# Patient Record
Sex: Female | Born: 1980 | Race: Black or African American | Hispanic: No | Smoking: Never smoker
Health system: Southern US, Community
[De-identification: ages and names within clinical notes are randomized; demographics above are authoritative.]

## PROBLEM LIST (undated history)

## (undated) DIAGNOSIS — N390 Urinary tract infection, site not specified: Secondary | ICD-10-CM

## (undated) DIAGNOSIS — F319 Bipolar disorder, unspecified: Secondary | ICD-10-CM

## (undated) DIAGNOSIS — F329 Major depressive disorder, single episode, unspecified: Secondary | ICD-10-CM

## (undated) DIAGNOSIS — F909 Attention-deficit hyperactivity disorder, unspecified type: Secondary | ICD-10-CM

## (undated) DIAGNOSIS — F419 Anxiety disorder, unspecified: Secondary | ICD-10-CM

## (undated) DIAGNOSIS — F32A Depression, unspecified: Secondary | ICD-10-CM

## (undated) DIAGNOSIS — F4310 Post-traumatic stress disorder, unspecified: Secondary | ICD-10-CM

## (undated) DIAGNOSIS — R51 Headache: Secondary | ICD-10-CM

## (undated) DIAGNOSIS — R519 Headache, unspecified: Secondary | ICD-10-CM

## (undated) HISTORY — PX: ECTOPIC PREGNANCY SURGERY: SHX613

## (undated) HISTORY — PX: LAPAROSCOPY: SHX197

## (undated) HISTORY — PX: WISDOM TOOTH EXTRACTION: SHX21

## (undated) HISTORY — PX: TUBAL LIGATION: SHX77

---
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
## 2000-02-22 ENCOUNTER — Inpatient Hospital Stay (HOSPITAL_COMMUNITY): Admission: AD | Admit: 2000-02-22 | Discharge: 2000-02-22 | Payer: Self-pay | Admitting: *Deleted

## 2000-07-29 ENCOUNTER — Inpatient Hospital Stay (HOSPITAL_COMMUNITY): Admission: AD | Admit: 2000-07-29 | Discharge: 2000-07-31 | Payer: Self-pay | Admitting: Obstetrics and Gynecology

## 2000-09-11 ENCOUNTER — Other Ambulatory Visit: Admission: RE | Admit: 2000-09-11 | Discharge: 2000-09-11 | Payer: Self-pay | Admitting: Obstetrics and Gynecology

## 2000-10-23 ENCOUNTER — Inpatient Hospital Stay (HOSPITAL_COMMUNITY): Admission: AD | Admit: 2000-10-23 | Discharge: 2000-10-23 | Payer: Self-pay | Admitting: Obstetrics and Gynecology

## 2001-05-15 ENCOUNTER — Inpatient Hospital Stay (HOSPITAL_COMMUNITY): Admission: AD | Admit: 2001-05-15 | Discharge: 2001-05-15 | Payer: Self-pay | Admitting: Obstetrics and Gynecology

## 2002-01-16 ENCOUNTER — Other Ambulatory Visit: Admission: RE | Admit: 2002-01-16 | Discharge: 2002-01-16 | Payer: Self-pay | Admitting: Obstetrics and Gynecology

## 2002-03-01 ENCOUNTER — Ambulatory Visit (HOSPITAL_COMMUNITY): Admission: RE | Admit: 2002-03-01 | Discharge: 2002-03-01 | Payer: Self-pay | Admitting: Obstetrics and Gynecology

## 2002-04-01 ENCOUNTER — Inpatient Hospital Stay (HOSPITAL_COMMUNITY): Admission: AD | Admit: 2002-04-01 | Discharge: 2002-04-01 | Payer: Self-pay | Admitting: Obstetrics and Gynecology

## 2002-10-21 ENCOUNTER — Other Ambulatory Visit: Admission: RE | Admit: 2002-10-21 | Discharge: 2002-10-21 | Payer: Self-pay | Admitting: Obstetrics and Gynecology

## 2002-11-08 ENCOUNTER — Emergency Department (HOSPITAL_COMMUNITY): Admission: EM | Admit: 2002-11-08 | Discharge: 2002-11-08 | Payer: Self-pay | Admitting: Emergency Medicine

## 2002-12-28 ENCOUNTER — Emergency Department (HOSPITAL_COMMUNITY): Admission: EM | Admit: 2002-12-28 | Discharge: 2002-12-28 | Payer: Self-pay | Admitting: Emergency Medicine

## 2003-10-23 ENCOUNTER — Emergency Department (HOSPITAL_COMMUNITY): Admission: EM | Admit: 2003-10-23 | Discharge: 2003-10-23 | Payer: Self-pay | Admitting: Emergency Medicine

## 2004-06-08 ENCOUNTER — Emergency Department (HOSPITAL_COMMUNITY): Admission: EM | Admit: 2004-06-08 | Discharge: 2004-06-08 | Payer: Self-pay | Admitting: *Deleted

## 2004-09-04 ENCOUNTER — Emergency Department (HOSPITAL_COMMUNITY): Admission: EM | Admit: 2004-09-04 | Discharge: 2004-09-04 | Payer: Self-pay | Admitting: *Deleted

## 2004-10-07 ENCOUNTER — Emergency Department (HOSPITAL_COMMUNITY): Admission: EM | Admit: 2004-10-07 | Discharge: 2004-10-07 | Payer: Self-pay | Admitting: Emergency Medicine

## 2005-04-12 ENCOUNTER — Emergency Department (HOSPITAL_COMMUNITY): Admission: EM | Admit: 2005-04-12 | Discharge: 2005-04-12 | Payer: Self-pay | Admitting: Emergency Medicine

## 2005-06-18 ENCOUNTER — Emergency Department (HOSPITAL_COMMUNITY): Admission: EM | Admit: 2005-06-18 | Discharge: 2005-06-18 | Payer: Self-pay | Admitting: Emergency Medicine

## 2005-06-30 ENCOUNTER — Emergency Department (HOSPITAL_COMMUNITY): Admission: EM | Admit: 2005-06-30 | Discharge: 2005-06-30 | Payer: Self-pay | Admitting: Emergency Medicine

## 2005-07-12 ENCOUNTER — Ambulatory Visit (HOSPITAL_COMMUNITY): Admission: RE | Admit: 2005-07-12 | Discharge: 2005-07-12 | Payer: Self-pay | Admitting: Obstetrics & Gynecology

## 2005-12-01 ENCOUNTER — Inpatient Hospital Stay (HOSPITAL_COMMUNITY): Admission: AD | Admit: 2005-12-01 | Discharge: 2005-12-01 | Payer: Self-pay | Admitting: Obstetrics

## 2007-12-24 ENCOUNTER — Emergency Department (HOSPITAL_COMMUNITY): Admission: EM | Admit: 2007-12-24 | Discharge: 2007-12-24 | Payer: Self-pay | Admitting: Emergency Medicine

## 2008-05-21 ENCOUNTER — Emergency Department (HOSPITAL_COMMUNITY): Admission: EM | Admit: 2008-05-21 | Discharge: 2008-05-21 | Payer: Self-pay | Admitting: Emergency Medicine

## 2008-10-06 ENCOUNTER — Emergency Department (HOSPITAL_COMMUNITY): Admission: EM | Admit: 2008-10-06 | Discharge: 2008-10-07 | Payer: Self-pay | Admitting: Emergency Medicine

## 2008-11-17 ENCOUNTER — Inpatient Hospital Stay (HOSPITAL_COMMUNITY): Admission: AD | Admit: 2008-11-17 | Discharge: 2008-11-17 | Payer: Self-pay | Admitting: Obstetrics and Gynecology

## 2008-12-10 ENCOUNTER — Ambulatory Visit: Payer: Self-pay | Admitting: Obstetrics and Gynecology

## 2009-01-19 ENCOUNTER — Ambulatory Visit: Payer: Self-pay | Admitting: Obstetrics & Gynecology

## 2009-01-19 ENCOUNTER — Ambulatory Visit (HOSPITAL_COMMUNITY): Admission: RE | Admit: 2009-01-19 | Discharge: 2009-01-19 | Payer: Self-pay | Admitting: Obstetrics & Gynecology

## 2009-02-04 ENCOUNTER — Ambulatory Visit: Payer: Self-pay | Admitting: Obstetrics and Gynecology

## 2009-02-19 ENCOUNTER — Ambulatory Visit: Payer: Self-pay | Admitting: Obstetrics & Gynecology

## 2009-02-26 ENCOUNTER — Inpatient Hospital Stay (HOSPITAL_COMMUNITY): Admission: AD | Admit: 2009-02-26 | Discharge: 2009-02-26 | Payer: Self-pay | Admitting: Obstetrics & Gynecology

## 2009-03-19 ENCOUNTER — Ambulatory Visit: Payer: Self-pay | Admitting: Obstetrics and Gynecology

## 2009-08-19 ENCOUNTER — Emergency Department (HOSPITAL_COMMUNITY): Admission: EM | Admit: 2009-08-19 | Discharge: 2009-08-19 | Payer: Self-pay | Admitting: Family Medicine

## 2009-10-14 ENCOUNTER — Ambulatory Visit: Payer: Self-pay | Admitting: Obstetrics and Gynecology

## 2009-10-16 ENCOUNTER — Ambulatory Visit (HOSPITAL_COMMUNITY): Admission: RE | Admit: 2009-10-16 | Discharge: 2009-10-16 | Payer: Self-pay | Admitting: Obstetrics and Gynecology

## 2009-11-24 ENCOUNTER — Emergency Department (HOSPITAL_COMMUNITY)
Admission: EM | Admit: 2009-11-24 | Discharge: 2009-11-25 | Payer: Self-pay | Source: Home / Self Care | Admitting: Emergency Medicine

## 2010-01-08 ENCOUNTER — Emergency Department (HOSPITAL_COMMUNITY)
Admission: EM | Admit: 2010-01-08 | Discharge: 2010-01-08 | Payer: Self-pay | Source: Home / Self Care | Admitting: Family Medicine

## 2010-01-13 ENCOUNTER — Ambulatory Visit: Payer: Self-pay | Admitting: Obstetrics & Gynecology

## 2010-01-27 ENCOUNTER — Ambulatory Visit
Admission: RE | Admit: 2010-01-27 | Discharge: 2010-01-27 | Payer: Self-pay | Source: Home / Self Care | Attending: Obstetrics and Gynecology | Admitting: Obstetrics and Gynecology

## 2010-03-30 LAB — WET PREP, GENITAL: Yeast Wet Prep HPF POC: NONE SEEN

## 2010-03-30 LAB — URINE MICROSCOPIC-ADD ON

## 2010-03-30 LAB — URINALYSIS, ROUTINE W REFLEX MICROSCOPIC
Glucose, UA: NEGATIVE mg/dL
Ketones, ur: NEGATIVE mg/dL
Leukocytes, UA: NEGATIVE
pH: 7.5 (ref 5.0–8.0)

## 2010-03-30 LAB — HEPATIC FUNCTION PANEL
AST: 18 U/L (ref 0–37)
Alkaline Phosphatase: 54 U/L (ref 39–117)
Bilirubin, Direct: 0.1 mg/dL (ref 0.0–0.3)
Total Bilirubin: 0.6 mg/dL (ref 0.3–1.2)

## 2010-03-30 LAB — BASIC METABOLIC PANEL
CO2: 22 mEq/L (ref 19–32)
Chloride: 107 mEq/L (ref 96–112)
Glucose, Bld: 106 mg/dL — ABNORMAL HIGH (ref 70–99)
Potassium: 4 mEq/L (ref 3.5–5.1)
Sodium: 139 mEq/L (ref 135–145)

## 2010-03-30 LAB — DIFFERENTIAL
Basophils Relative: 0 % (ref 0–1)
Eosinophils Absolute: 0 10*3/uL (ref 0.0–0.7)
Lymphs Abs: 1.2 10*3/uL (ref 0.7–4.0)
Monocytes Absolute: 0.6 10*3/uL (ref 0.1–1.0)
Monocytes Relative: 7 % (ref 3–12)

## 2010-03-30 LAB — CBC
HCT: 35.8 % — ABNORMAL LOW (ref 36.0–46.0)
Hemoglobin: 12.1 g/dL (ref 12.0–15.0)
MCH: 28.1 pg (ref 26.0–34.0)
MCHC: 33.8 g/dL (ref 30.0–36.0)

## 2010-03-30 LAB — LIPASE, BLOOD: Lipase: 25 U/L (ref 11–59)

## 2010-04-02 LAB — CBC
HCT: 39.1 % (ref 36.0–46.0)
MCH: 27.4 pg (ref 26.0–34.0)
MCHC: 32.5 g/dL (ref 30.0–36.0)
MCV: 84.3 fL (ref 78.0–100.0)
Platelets: 242 10*3/uL (ref 150–400)
RDW: 14.4 % (ref 11.5–15.5)
WBC: 5.1 10*3/uL (ref 4.0–10.5)

## 2010-04-02 LAB — POCT I-STAT, CHEM 8
Calcium, Ion: 1.14 mmol/L (ref 1.12–1.32)
Chloride: 107 mEq/L (ref 96–112)
Glucose, Bld: 78 mg/dL (ref 70–99)
HCT: 43 % (ref 36.0–46.0)
TCO2: 25 mmol/L (ref 0–100)

## 2010-04-02 LAB — DIFFERENTIAL
Basophils Absolute: 0 10*3/uL (ref 0.0–0.1)
Basophils Relative: 0 % (ref 0–1)
Eosinophils Absolute: 0.1 10*3/uL (ref 0.0–0.7)
Eosinophils Relative: 2 % (ref 0–5)
Lymphocytes Relative: 34 % (ref 12–46)
Monocytes Absolute: 0.6 10*3/uL (ref 0.1–1.0)

## 2010-04-02 LAB — POCT URINALYSIS DIPSTICK
Glucose, UA: NEGATIVE mg/dL
Nitrite: NEGATIVE
Protein, ur: NEGATIVE mg/dL
Urobilinogen, UA: 0.2 mg/dL (ref 0.0–1.0)

## 2010-04-03 LAB — PREGNANCY, URINE: Preg Test, Ur: NEGATIVE

## 2010-04-03 LAB — CBC
HCT: 37.2 % (ref 36.0–46.0)
Platelets: 227 10*3/uL (ref 150–400)
RDW: 14.2 % (ref 11.5–15.5)
WBC: 4.9 10*3/uL (ref 4.0–10.5)

## 2010-04-21 LAB — URINALYSIS, ROUTINE W REFLEX MICROSCOPIC
Ketones, ur: NEGATIVE mg/dL
Leukocytes, UA: NEGATIVE
Nitrite: NEGATIVE
Protein, ur: NEGATIVE mg/dL
Urobilinogen, UA: 0.2 mg/dL (ref 0.0–1.0)

## 2010-04-21 LAB — WET PREP, GENITAL

## 2010-04-21 LAB — CBC
HCT: 35 % — ABNORMAL LOW (ref 36.0–46.0)
Hemoglobin: 11.6 g/dL — ABNORMAL LOW (ref 12.0–15.0)
MCHC: 33 g/dL (ref 30.0–36.0)
RBC: 4.08 MIL/uL (ref 3.87–5.11)

## 2010-04-21 LAB — COMPREHENSIVE METABOLIC PANEL
ALT: 10 U/L (ref 0–35)
Alkaline Phosphatase: 47 U/L (ref 39–117)
BUN: 8 mg/dL (ref 6–23)
CO2: 26 mEq/L (ref 19–32)
GFR calc non Af Amer: >60 mL/min (ref >60)
Glucose, Bld: 89 mg/dL (ref 70–99)
Potassium: 3.7 mEq/L (ref 3.5–5.1)
Sodium: 137 mEq/L (ref 135–145)

## 2010-04-21 LAB — URINE MICROSCOPIC-ADD ON

## 2010-04-21 LAB — POCT PREGNANCY, URINE: Preg Test, Ur: NEGATIVE

## 2010-04-21 LAB — GC/CHLAMYDIA PROBE AMP, GENITAL: Chlamydia, DNA Probe: NEGATIVE

## 2010-04-23 LAB — URINE MICROSCOPIC-ADD ON

## 2010-04-23 LAB — COMPREHENSIVE METABOLIC PANEL
Albumin: 4 g/dL (ref 3.5–5.2)
Alkaline Phosphatase: 53 U/L (ref 39–117)
BUN: 7 mg/dL (ref 6–23)
Creatinine, Ser: 0.67 mg/dL (ref 0.4–1.2)
Potassium: 4 mEq/L (ref 3.5–5.1)
Total Protein: 7.3 g/dL (ref 6.0–8.3)

## 2010-04-23 LAB — DIFFERENTIAL
Lymphocytes Relative: 28 % (ref 12–46)
Monocytes Absolute: 0.8 10*3/uL (ref 0.1–1.0)
Monocytes Relative: 10 % (ref 3–12)
Neutro Abs: 4.4 10*3/uL (ref 1.7–7.7)

## 2010-04-23 LAB — URINALYSIS, ROUTINE W REFLEX MICROSCOPIC
Glucose, UA: NEGATIVE mg/dL
Specific Gravity, Urine: 1.026 (ref 1.005–1.030)
Urobilinogen, UA: 1 mg/dL (ref 0.0–1.0)
pH: 6.5 (ref 5.0–8.0)

## 2010-04-23 LAB — CBC
HCT: 39.2 % (ref 36.0–46.0)
Platelets: 215 10*3/uL (ref 150–400)
RDW: 14.9 % (ref 11.5–15.5)

## 2010-04-27 LAB — POCT CARDIAC MARKERS
CKMB, poc: <1 ng/mL — ABNORMAL LOW (ref 1.0–8.0)
Myoglobin, poc: 33 ng/mL (ref 12–200)
Troponin i, poc: <0.05 ng/mL (ref 0.00–0.09)

## 2010-06-04 NOTE — H&P (Signed)
   NAME:  Joy Pittman, Joy Pittman                         ACCOUNT NO.:  [id number]   MEDICAL RECORD NO.:  [id number]                   PATIENT TYPE:  AMB   LOCATION:  SDC                                  FACILITY:  [facility]   PHYSICIAN:  James A. Ashley Royalty, M.D.             DATE OF BIRTH:  07/18/1980   DATE OF ADMISSION:  03/01/2002  DATE OF DISCHARGE:                                HISTORY & PHYSICAL   BRIEF HISTORY:  The patient is a 30 year old gravida 4, para 3, AB 1, states  a desire for attempt at permanent surgical sterilization.   MEDICATIONS:  None.   PAST MEDICAL HISTORY:  Negative.   PAST SURGICAL HISTORY:  Ectopic pregnancy May 2001.   ALLERGIES:  None.   FAMILY HISTORY:  Noncontributory.   SOCIAL HISTORY:  The patient denies use of tobacco or significant alcohol.   REVIEW OF SYSTEMS:  Noncontributory.   PHYSICAL EXAMINATION:  GENERAL:  Well-developed, well-nourished pleasant  black female in no acute distress.  VITAL SIGNS:  Afebrile; vital signs stable.  SKIN:  Warm and dry without lesions.  LYMPHATICS:  There is no supraclavicular, cervical, or inguinal adenopathy.  HEENT:  Normocephalic.  NECK:  Supple without thyromegaly.  CHEST:  Lungs clear.  CARDIAC:  Regular rate and rhythm without murmurs, gallops, or rubs.  BACK:  Exam deferred.  ABDOMEN:  Soft and nontender without masses or organomegaly.  Bowel sounds  are active.  MUSCULOSKELETAL:  Full range of motion without edema, cyanosis, or CVA  tenderness.  PELVIC:  Examination is deferred until examination under anesthesia.   IMPRESSION:  Desire for attempt at permanent surgical sterilization.    PLAN:  Laparoscopic bilateral tubal sterilization procedure.  The risks,  benefits, complications, and alternatives fully discussed with the patient.  Permanency and failure rates of various techniques including but not limited  to Falope-Ring, bipolar cautery, and mini laparotomy with partial  salpingectomy discussed and  accepted.  Questions invited and answered.                                               James A. Ashley Royalty, M.D.    JAM/MEDQ  D:  03/01/2002  T:  03/01/2002  Job:  [id number]

## 2010-06-04 NOTE — Op Note (Signed)
[facility]  Patient:    Joy Pittman                      MRN: [id number] Proc. Date: 06/11/99 Adm. Date:  [id number] Disc. Date: [id number] Attending:  Deniece Ree CC:         Deniece Ree, M.D.                           Operative Report  PREOPERATIVE DIAGNOSIS:       Ruptured ectopic pregnancy.  POSTOPERATIVE DIAGNOSIS:      Ruptured ectopic pregnancy.  OPERATION:  SURGEON:                      Kathreen Cosier, M.D.  ASSISTANT:  ANESTHESIA:  ESTIMATED BLOOD LOSS:  DESCRIPTION OF PROCEDURE:     The patient was placed on the operating table in he supine position and general anesthesia administered by Dorinda Hill T. Pamalee Leyden, M.D. he abdomen was prepped and draped.  The bladder was emptied with a Foley catheter. A transverse suprapubic incision was made and carried down to the rectus fascia. The fascia cleaned and incised the length of the incision.  The rectus muscles were  retracted laterally and the peritoneum incised longitudinally.  500 cc of free blood was aspirated from the peritoneal cavity.  The uterus was normal size. The left tube and ovary were normal.  The right ovary was normal.  On the right side, she had a 6 x 6 cm ruptured ampullary ectopic pregnancy.  Kelly clamps were placed in the mesosalpinx below the tube in series and using Metzenbaum scissors, the distal portion of the tube was removed.  Hemostasis was achieved using interrupted sutures of 0 chromic.  The peritoneal cavity was irrigated and large amount of clots removed.  Abdomen closed in layers.  The peritoneum with continuous suture of 0 chromic, the fascia with continuous suture of 0 Dexon, and the skin closed with subcuticular stitch of 3-0 plain.  Estimated blood loss about 600 cc.  The patient tolerated the procedure well and was taken to the recovery room in good condition. DD:  06/11/99 TD:  06/15/99 Job: [id number]

## 2010-06-04 NOTE — Op Note (Signed)
NAME:  Joy Pittman, Joy Pittman                         ACCOUNT NO.:  [id number]   MEDICAL RECORD NO.:  [id number]                   PATIENT TYPE:  AMB   LOCATION:  SDC                                  FACILITY:  [facility]   PHYSICIAN:  James A. Ashley Royalty, M.D.             DATE OF BIRTH:  04-18-1980   DATE OF PROCEDURE:  03/01/2002  DATE OF DISCHARGE:                                 OPERATIVE REPORT   PREOPERATIVE DIAGNOSES:  1. Operative attempt at permanent surgical sterilization.  2. Previous ectopic pregnancy -- patient unsure as to which side and the     exact procedure performed.   POSTOPERATIVE DIAGNOSES:  1. Operative attempt at permanent surgical sterilization.  2. Status post apparent right ectopic with fimbriectomy.   PROCEDURE:  Laparoscopic bilateral tubal sterilization procedure (Falope  rings).   SURGEON:  Rudy Jew. Ashley Royalty, M.D.   ANESTHESIA:  General endotracheal.   ESTIMATED BLOOD LOSS:  Less than 10 cc.   COMPLICATIONS:  None.   PACKS AND DRAINS:  None.   DESCRIPTION OF PROCEDURE:  The patient was taken to the operating room and  placed in the dorsal supine position.  After adequate general endotracheal  anesthesia was administered, she was prepped and draped in the usual manner  for abdominal and vaginal surgery.  Posterior weighted retractor was placed  per vagina.  The anterior lip of the cervix was grasped with a single-  toothed tenaculum.  Gel-coated uterine manipulator was placed per cervix.  The bladder was drained with a red rubber catheter.   Next, 3 cc of 0.25% bupivacaine were instilled into the inferior aspect at  the umbilicus.  A 1.2 cm infraumbilical incision was made in the  longitudinal plane.  The size 10-11 disposable laparoscopic trocar was  placed into the abdominal cavity.  Its location was verified by placement of  the laparoscope.  There was no evidence of any trauma.  Pneumoperitoneum was  created with CO2 and maintained throughout the  procedure.   Next, the 7 mm low plane trocar was placed suprapubically in the midline,  through the patient's old laparotomy scar.  Direct visualization and  transillumination techniques were employed; 3 cc of bupivacaine preceded the  placement.  The pelvis was thoroughly surveyed.   The uterus was normal size, shape and contour.  The left fallopian tube was  normal size, shape and contour in length; with luxuriant fimbria.  The left  ovary was normal size, shape and contour without evidence of any cysts or  any endometriosis.  The right ovary was normal size, shape and contour  without evidence of any cysts or any endometriosis.  The right fallopian  tube was essentially normal size and shape, except distally where the  fimbria was noted to be absent.  It was surmised by the operator that the  patient apparently had an ectopic in the ampullary area, which was removed  by partial  salpingectomy and fimbriectomy.  Appropriate photos were  obtained.  The remainder of the peritoneal surfaces were smooth and  glistening.   At this point, the right fallopian tube was grasped and traced as far  distally as possible.  An avascular area in the ampullary portion was chosen  for ring placement.  A fallopian ring was applied.  At the initial placement  of the ring, the operator could not be 100% sure that an adequate purchase  was obtained.  Hence the fallopian was removed.  The operator removed  proximally and successfully placed fallopian ring in the distal isthmic  proximal ampullary portion.  An excellent knuckle of tube was noted to be  contained within the ring.  Excellent blanching of tissue was noted.  The  ring was clearly placed proximally, so that even if the fallopian would  recanalize distally the patient would not be at increased risk for pregnancy  (more than the accepted percentage chance from any typical fallopian tubal  sterilization procedure).   The left fallopian tube was  then grasped and traced to its fimbriated end.  An avascular area was chosen at distal isthmic to proximal ampullary portion  for ring placement.  A fallopian ring was applied without difficulty.  An  excellent knuckle of tube was noted to be contained within the ring.  Excellent blanching of tissue was noted.  Hemostasis was noted.   Appropriate photos were obtained.  At this point the patient was felt to  have benefited maximally from the surgical procedure.  The abdominal  instruments were removed and pneumoperitoneum evacuated.  The fascial  defects were closed with 0 Vicryl in an interrupted fashion.  The skin was  closed with 3-0 chromic in a subcuticular fashion.  The remainder of the 10  cc 0.25% bupivacaine were instilled into the incisions to aid in  postoperative analgesia.  Vaginal instruments were removed and hemostasis  noted.   The patient was then returned to the recovery room in excellent condition.                                                 James A. Ashley Royalty, M.D.    JAM/MEDQ  D:  03/01/2002  T:  03/01/2002  Job:  [id number]

## 2010-06-04 NOTE — Discharge Summary (Signed)
[facility]  Patient:    Joy Pittman                      MRN: [id number] Adm. Date:  [id number] Disc. Date: [id number] Attending:  Deniece Ree CC:         Deniece Ree, M.D.                           Discharge Summary  HOSPITAL COURSE:              The patient is a 30 year old gravida 3, para 2-0-0-2, whose last menstrual period was May 18, 1999.  She was admitted with right lower quadrant pain and generalized abdominal pain, hemoglobin 10.5, hCG 956.  Ultrasound revealed a large complex right adnexal mass suspicious for an ectopic.  There was a large amount of free fluid in the peritoneal cavity. Patient was taken to the operating room with a diagnosis of a ruptured right ectopic pregnancy and a 6 x 6-cm right ruptured ampullary ectopic was removed. She had a partial salpingectomy performed.  Both ovaries were normal and the left ______ was normal.  Her hemoglobin on admission was 10.5; postop 8.7. She was asymptomatic and was discharged home on the second postoperative day, ambulatory, on a regular diet, on Tylenol No. 3 one q.4h. p.r.n. for pain and ferrous sulfate 325 mg q.d., to see Dr. Deniece Ree in three weeks.  DISCHARGE DIAGNOSIS:          Status post partial salpingectomy for ruptured                               right ectopic pregnancy. DD:  06/13/99 TD:  06/14/99 Job: [id number]

## 2010-07-22 ENCOUNTER — Emergency Department (HOSPITAL_COMMUNITY): Payer: Medicaid Other

## 2010-07-22 ENCOUNTER — Inpatient Hospital Stay (INDEPENDENT_AMBULATORY_CARE_PROVIDER_SITE_OTHER)
Admission: RE | Admit: 2010-07-22 | Discharge: 2010-07-22 | Disposition: A | Discharge status: Home / Self Care | Payer: Medicaid Other | Source: Ambulatory Visit | Attending: Family Medicine | Admitting: Family Medicine

## 2010-07-22 ENCOUNTER — Emergency Department (HOSPITAL_COMMUNITY)
Admission: EM | Admit: 2010-07-22 | Discharge: 2010-07-22 | Disposition: A | Discharge status: Home / Self Care | Payer: Medicaid Other | Attending: Emergency Medicine | Admitting: Emergency Medicine

## 2010-07-22 DIAGNOSIS — R1013 Epigastric pain: Secondary | ICD-10-CM | POA: Insufficient documentation

## 2010-07-22 DIAGNOSIS — B9689 Other specified bacterial agents as the cause of diseases classified elsewhere: Secondary | ICD-10-CM | POA: Insufficient documentation

## 2010-07-22 DIAGNOSIS — R109 Unspecified abdominal pain: Secondary | ICD-10-CM

## 2010-07-22 DIAGNOSIS — N760 Acute vaginitis: Secondary | ICD-10-CM | POA: Insufficient documentation

## 2010-07-22 DIAGNOSIS — A499 Bacterial infection, unspecified: Secondary | ICD-10-CM | POA: Insufficient documentation

## 2010-07-22 DIAGNOSIS — N390 Urinary tract infection, site not specified: Secondary | ICD-10-CM | POA: Insufficient documentation

## 2010-07-22 LAB — COMPREHENSIVE METABOLIC PANEL
ALT: 9 U/L (ref 0–35)
AST: 23 U/L (ref 0–37)
Alkaline Phosphatase: 60 U/L (ref 39–117)
CO2: 26 mEq/L (ref 19–32)
Calcium: 9.4 mg/dL (ref 8.4–10.5)
Chloride: 102 mEq/L (ref 96–112)
GFR calc Af Amer: >60 mL/min (ref >60)
GFR calc non Af Amer: >60 mL/min (ref >60)
Glucose, Bld: 71 mg/dL (ref 70–99)
Potassium: 4.9 mEq/L (ref 3.5–5.1)
Sodium: 136 mEq/L (ref 135–145)
Total Bilirubin: 0.2 mg/dL — ABNORMAL LOW (ref 0.3–1.2)

## 2010-07-22 LAB — CBC
Hemoglobin: 12 g/dL (ref 12.0–15.0)
MCHC: 33 g/dL (ref 30.0–36.0)
Platelets: 243 10*3/uL (ref 150–400)
RBC: 4.45 MIL/uL (ref 3.87–5.11)

## 2010-07-22 LAB — POCT URINALYSIS DIP (DEVICE)
Bilirubin Urine: NEGATIVE
Glucose, UA: NEGATIVE mg/dL
Nitrite: NEGATIVE

## 2010-07-22 LAB — URINE MICROSCOPIC-ADD ON

## 2010-07-22 LAB — DIFFERENTIAL
Basophils Absolute: 0 10*3/uL (ref 0.0–0.1)
Basophils Relative: 1 % (ref 0–1)
Monocytes Absolute: 0.4 10*3/uL (ref 0.1–1.0)
Neutro Abs: 2.4 10*3/uL (ref 1.7–7.7)
Neutrophils Relative %: 50 % (ref 43–77)

## 2010-07-22 LAB — URINALYSIS, ROUTINE W REFLEX MICROSCOPIC
Glucose, UA: NEGATIVE mg/dL
Nitrite: POSITIVE — AB
Protein, ur: NEGATIVE mg/dL

## 2010-07-22 LAB — WET PREP, GENITAL: Trich, Wet Prep: NONE SEEN

## 2010-07-23 LAB — GC/CHLAMYDIA PROBE AMP, GENITAL: Chlamydia, DNA Probe: NEGATIVE

## 2010-07-24 LAB — URINE CULTURE: Culture  Setup Time: 201207051732

## 2010-08-04 ENCOUNTER — Emergency Department (HOSPITAL_COMMUNITY)
Admission: EM | Admit: 2010-08-04 | Discharge: 2010-08-04 | Discharge status: Against Medical Advice | Payer: Medicaid Other | Attending: Emergency Medicine | Admitting: Emergency Medicine

## 2010-08-04 DIAGNOSIS — R300 Dysuria: Secondary | ICD-10-CM | POA: Insufficient documentation

## 2010-10-22 LAB — URINALYSIS, ROUTINE W REFLEX MICROSCOPIC
Nitrite: NEGATIVE
Specific Gravity, Urine: 1.022 (ref 1.005–1.030)
Urobilinogen, UA: 1 mg/dL (ref 0.0–1.0)
pH: 6 (ref 5.0–8.0)

## 2010-10-22 LAB — URINE MICROSCOPIC-ADD ON

## 2010-10-22 LAB — GC/CHLAMYDIA PROBE AMP, GENITAL
Chlamydia, DNA Probe: NEGATIVE
GC Probe Amp, Genital: POSITIVE — AB

## 2010-10-22 LAB — WET PREP, GENITAL: Yeast Wet Prep HPF POC: NONE SEEN

## 2010-10-27 ENCOUNTER — Inpatient Hospital Stay (INDEPENDENT_AMBULATORY_CARE_PROVIDER_SITE_OTHER)
Admission: RE | Admit: 2010-10-27 | Discharge: 2010-10-27 | Disposition: A | Discharge status: Home / Self Care | Payer: Medicaid Other | Source: Ambulatory Visit | Attending: Family Medicine | Admitting: Family Medicine

## 2010-10-27 DIAGNOSIS — J069 Acute upper respiratory infection, unspecified: Secondary | ICD-10-CM

## 2010-12-03 ENCOUNTER — Emergency Department (HOSPITAL_COMMUNITY)
Admission: EM | Admit: 2010-12-03 | Discharge: 2010-12-03 | Disposition: A | Discharge status: Home / Self Care | Payer: Medicaid Other | Attending: Emergency Medicine | Admitting: Emergency Medicine

## 2010-12-03 ENCOUNTER — Encounter: Payer: Self-pay | Admitting: Emergency Medicine

## 2010-12-03 DIAGNOSIS — M549 Dorsalgia, unspecified: Secondary | ICD-10-CM

## 2010-12-03 MED ORDER — HYDROCODONE-ACETAMINOPHEN 5-325 MG PO TABS: 2.0000 | ORAL_TABLET | ORAL | Status: AC | PRN

## 2010-12-03 MED ORDER — DIAZEPAM 5 MG PO TABS: 5.0000 mg | ORAL_TABLET | Freq: Two times a day (BID) | ORAL | Status: AC

## 2010-12-03 NOTE — ED Provider Notes (Signed)
History     CSN: [id number] Arrival date & time: 12/03/2010  8:37 PM   First MD Initiated Contact with Patient 12/03/10 2057      Chief Complaint  Patient presents with  . 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  She reports that the pain has been worse over the last 2 days.  No recent injury or trauma.  She reports that she has tried taking Ibuprofen 800mg  and Robaxin for the pain without relief.  She reports that the pain feels like a muscle spasm.  Patient is a 30 y.o. female presenting with back pain. The history is provided by the patient.  Back Pain  This is a chronic problem. The problem occurs constantly. The problem has been gradually worsening. The pain is associated with no known injury. Quality: Tightness, spasm. The pain does not radiate. The symptoms are aggravated by bending and twisting. Pertinent negatives include no chest pain, no fever, no numbness, no weight loss, no headaches, no abdominal pain, no bowel incontinence, no perianal numbness, no bladder incontinence, no dysuria, no pelvic pain, no leg pain, no paresthesias, no paresis, no tingling and no weakness.    History reviewed. No pertinent past medical history.  Past Surgical History  Procedure Date  . Ectopic pregnancy surgery   . Tubal ligation   . Laparoscopy     Family History  Problem Relation Age of Onset  . Cancer Other   . Hypertension Other   . Diabetes Other     History  Substance Use Topics  . Smoking status: Never Smoker   . Smokeless tobacco: Not on file  . Alcohol Use: No    OB History    Grav Para Term Preterm Abortions TAB SAB Ect Mult Living                  Review of Systems  Constitutional: Negative for fever, chills, weight loss and unexpected weight change.    Respiratory: Negative for chest tightness and shortness of breath.   Cardiovascular: Negative for chest pain.  Gastrointestinal: Negative for nausea, vomiting, abdominal pain, diarrhea and bowel incontinence.  Genitourinary: Negative for bladder incontinence, dysuria, hematuria, flank pain, decreased urine volume, difficulty urinating and pelvic pain.  Musculoskeletal: Positive for back pain. Negative for joint swelling and gait problem.  Skin: Negative for color change, pallor and rash.  Neurological: Negative for dizziness, tingling, syncope, weakness, light-headedness, numbness, headaches and paresthesias.  Psychiatric/Behavioral: Negative for confusion.    Allergies  Review of patient's allergies indicates no known allergies.  Home Medications   Current Outpatient Rx  Name Route Sig Dispense Refill  . IBUPROFEN 800 MG PO TABS Oral Take 800 mg by mouth every 8 (eight) hours as needed. For pain     . METHOCARBAMOL 750 MG PO TABS Oral Take 750 mg by mouth every 8 (eight) hours as needed. For muscle spasms/pain       BP 105/64  Pulse 78  Temp(Src) 97.9 F (36.6 C) (Oral)  Resp 20  SpO2 100%  LMP 12/01/2010  Physical Exam  Constitutional: She is oriented to person, place, and time. She appears well-developed and well-nourished. No distress.  HENT:  Head: Normocephalic and atraumatic.  Eyes: EOM are normal. Pupils are equal, round, and reactive to light.  Neck: Normal range of motion. Neck supple.  Cardiovascular: Normal  rate, regular rhythm and normal heart sounds.   Pulmonary/Chest: Effort normal and breath sounds normal. She exhibits no tenderness.  Musculoskeletal:       Cervical back: She exhibits no bony tenderness.       Thoracic back: She exhibits no bony tenderness.       Lumbar back: She exhibits decreased range of motion. She exhibits no swelling and no deformity.       Lumbar paraspinal tenderness.  Pain with ROM of the lumbar spine.    Neurological: She is alert  and oriented to person, place, and time. She has normal strength and normal reflexes. She displays normal reflexes. No cranial nerve deficit or sensory deficit. Coordination and gait normal.  Skin: Skin is warm and dry. No rash noted. She is not diaphoretic.  Psychiatric: She has a normal mood and affect.    ED Course  Procedures (including critical care time)  Labs Reviewed - No data to display No results found.   1. Back pain       MDM  No red flags of back pain.  Back pain similar to the back pain she has felt in the past.  No improvement with ibuprofen 800mg .  Therefore, gave patient a short course of hydrocodone and switched her muscle relaxer from Robaxin to Valium.        Pascal Lux [facility] 12/04/10 1215

## 2010-12-03 NOTE — ED Notes (Signed)
Pt states she has been having back pain for a while now but for the past 2 days she has been having sharp pain that shoots up to about her mid back and she states it makes her chest hurt

## 2010-12-05 NOTE — ED Provider Notes (Signed)
Medical screening examination/treatment/procedure(s) were performed by non-physician practitioner and as supervising physician I was immediately available for consultation/collaboration.   Gwyneth Sprout, MD 12/05/10 1535

## 2011-02-22 ENCOUNTER — Ambulatory Visit: Payer: Medicaid Other | Admitting: Physical Therapy

## 2011-03-01 ENCOUNTER — Ambulatory Visit: Payer: Medicaid Other | Admitting: Physical Therapy

## 2011-05-09 ENCOUNTER — Emergency Department (INDEPENDENT_AMBULATORY_CARE_PROVIDER_SITE_OTHER): Payer: Medicaid Other

## 2011-05-09 ENCOUNTER — Encounter (HOSPITAL_COMMUNITY): Payer: Self-pay

## 2011-05-09 ENCOUNTER — Emergency Department (INDEPENDENT_AMBULATORY_CARE_PROVIDER_SITE_OTHER)
Admission: EM | Admit: 2011-05-09 | Discharge: 2011-05-09 | Disposition: A | Discharge status: Home / Self Care | Payer: Medicaid Other | Source: Home / Self Care | Attending: Family Medicine | Admitting: Family Medicine

## 2011-05-09 DIAGNOSIS — S9781XA Crushing injury of right foot, initial encounter: Secondary | ICD-10-CM

## 2011-05-09 DIAGNOSIS — S9780XA Crushing injury of unspecified foot, initial encounter: Secondary | ICD-10-CM

## 2011-05-09 HISTORY — DX: Bipolar disorder, unspecified: F31.9

## 2011-05-09 MED ORDER — HYDROCODONE-ACETAMINOPHEN 5-500 MG PO TABS: 1.0000 | ORAL_TABLET | Freq: Four times a day (QID) | ORAL | Status: AC | PRN

## 2011-05-09 MED ORDER — IBUPROFEN 600 MG PO TABS: 600.0000 mg | ORAL_TABLET | Freq: Three times a day (TID) | ORAL | Status: AC | PRN
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
## 2011-05-09 NOTE — Discharge Instructions (Signed)
There no signs of bone fracture or injury in your foot x-rays. Use ice as much as possible and leg elevation today and tomorrow. Take the prescribed medications as instructed. Followup with the orthopedist number provided above if persistent symptoms after 1 week or go earlier to the emergency department if worsening symptoms despite following treatment.
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
## 2011-06-29 ENCOUNTER — Emergency Department (HOSPITAL_COMMUNITY)
Admission: EM | Admit: 2011-06-29 | Discharge: 2011-06-30 | Disposition: A | Discharge status: Home / Self Care | Payer: Medicaid Other | Attending: Emergency Medicine | Admitting: Emergency Medicine

## 2011-06-29 ENCOUNTER — Encounter (HOSPITAL_COMMUNITY): Payer: Self-pay | Admitting: Emergency Medicine

## 2011-06-29 DIAGNOSIS — R5383 Other fatigue: Secondary | ICD-10-CM | POA: Insufficient documentation

## 2011-06-29 DIAGNOSIS — R109 Unspecified abdominal pain: Secondary | ICD-10-CM | POA: Insufficient documentation

## 2011-06-29 DIAGNOSIS — R10819 Abdominal tenderness, unspecified site: Secondary | ICD-10-CM | POA: Insufficient documentation

## 2011-06-29 DIAGNOSIS — R5381 Other malaise: Secondary | ICD-10-CM | POA: Insufficient documentation

## 2011-06-29 DIAGNOSIS — K7689 Other specified diseases of liver: Secondary | ICD-10-CM | POA: Insufficient documentation

## 2011-06-29 LAB — CBC
HCT: 34.5 % — ABNORMAL LOW (ref 36.0–46.0)
MCV: 80.8 fL (ref 78.0–100.0)
Platelets: 249 10*3/uL (ref 150–400)
RBC: 4.27 MIL/uL (ref 3.87–5.11)
WBC: 7.6 10*3/uL (ref 4.0–10.5)

## 2011-06-29 LAB — URINALYSIS, ROUTINE W REFLEX MICROSCOPIC
Leukocytes, UA: NEGATIVE
Nitrite: NEGATIVE
Protein, ur: NEGATIVE mg/dL
Urobilinogen, UA: 0.2 mg/dL (ref 0.0–1.0)

## 2011-06-29 LAB — COMPREHENSIVE METABOLIC PANEL
ALT: 8 U/L (ref 0–35)
AST: 12 U/L (ref 0–37)
Alkaline Phosphatase: 61 U/L (ref 39–117)
CO2: 27 mEq/L (ref 19–32)
Calcium: 9.7 mg/dL (ref 8.4–10.5)
Chloride: 103 mEq/L (ref 96–112)
GFR calc Af Amer: >90 mL/min (ref >90)
GFR calc non Af Amer: >90 mL/min (ref >90)
Glucose, Bld: 102 mg/dL — ABNORMAL HIGH (ref 70–99)
Sodium: 140 mEq/L (ref 135–145)
Total Bilirubin: 0.2 mg/dL — ABNORMAL LOW (ref 0.3–1.2)

## 2011-06-29 LAB — DIFFERENTIAL
Basophils Absolute: 0 10*3/uL (ref 0.0–0.1)
Eosinophils Relative: 3 % (ref 0–5)
Lymphocytes Relative: 31 % (ref 12–46)
Lymphs Abs: 2.3 10*3/uL (ref 0.7–4.0)
Monocytes Absolute: 0.7 10*3/uL (ref 0.1–1.0)
Neutro Abs: 4.3 10*3/uL (ref 1.7–7.7)

## 2011-06-29 LAB — URINE MICROSCOPIC-ADD ON

## 2011-06-29 MED ORDER — MORPHINE SULFATE 4 MG/ML IJ SOLN
4.0000 mg | Freq: Once | INTRAMUSCULAR | Status: AC
  Administered 2011-06-29: 4 mg via INTRAVENOUS
  Filled 2011-06-29: qty 1

## 2011-06-29 NOTE — ED Provider Notes (Signed)
History     CSN: [id number]  Arrival date & time 06/29/11  1946   First MD Initiated Contact with Patient 06/29/11 2304      Chief Complaint  Patient presents with  . Abdominal Pain    (Consider location/radiation/quality/duration/timing/severity/associated sxs/prior treatment) HPI Patient 31 year old female who presents today complaining of 8/10 right-sided abdominal pain. Patient reports one week of symptoms. She was seen on Monday at an urgent care was told she had a urinary tract infection. Patient was started on Cipro but has had no relief in her symptoms. She denies ever having any urinary frequency, dysuria, or hematuria. Patient denies nausea or vomiting but endorses loose stools and reports 3 stools today. She has not noted any blood in her stools. Surgical history is remarkable for ectopic surgery with resultant laparoscopy as well as tubal ligation and prior evaluation for an ovarian cyst with laparoscopy. Patient did not feel that today symptoms feel similarly. She denies fevers or sick contacts. Patient's last mental period was May 3 she reports irregular periods. Patient denies any vaginal discharge or concerns you have a section transmitted disease today. Patient has radiation of her pain to the periumbilical area as well as to the right upper and right lower quadrants of the abdomen.There are no other associated or modifying factors.  Past Medical History  Diagnosis Date  . Bipolar 1 disorder     Past Surgical History  Procedure Date  . Ectopic pregnancy surgery   . Tubal ligation   . Laparoscopy     Family History  Problem Relation Age of Onset  . Cancer Other   . Hypertension Other   . Diabetes Other     History  Substance Use Topics  . Smoking status: Never Smoker   . Smokeless tobacco: Not on file  . Alcohol Use: No    OB History    Grav Para Term Preterm Abortions TAB SAB Ect Mult Living                  Review of Systems  Constitutional:  Negative.   HENT: Negative.   Eyes: Negative.   Respiratory: Negative.   Cardiovascular: Negative.   Gastrointestinal: Positive for abdominal pain and diarrhea.  Genitourinary: Negative.   Musculoskeletal: Negative.   Skin: Negative.   Neurological: Negative.   Hematological: Negative.   Psychiatric/Behavioral: Negative.   All other systems reviewed and are negative.    Allergies  Review of patient's allergies indicates no known allergies.  Home Medications   Current Outpatient Rx  Name Route Sig Dispense Refill  . AMPHETAMINE-DEXTROAMPHETAMINE 10 MG PO TABS Oral Take 10 mg by mouth 2 (two) times daily.    Marland Kitchen GABAPENTIN 100 MG PO CAPS Oral Take 100 mg by mouth 3 (three) times daily.    Marland Kitchen PRENATAL 27-0.8 MG PO TABS Oral Take 1 tablet by mouth daily.    . SULFAMETHOXAZOLE-TMP DS 800-160 MG PO TABS Oral Take 1 tablet by mouth 2 (two) times daily.    . TRAZODONE HCL 50 MG PO TABS Oral Take 50 mg by mouth at bedtime.      BP 109/69  Pulse 85  Temp 98.7 F (37.1 C) (Oral)  Resp 16  SpO2 100%  LMP 05/20/2011  Physical Exam  Nursing note and vitals reviewed. GEN: Well-developed, well-nourished female in no distress HEENT: Atraumatic, normocephalic.  EYES: PERRLA BL, no scleral icterus. NECK: Trachea midline, no meningismus CV: regular rate and rhythm. No murmurs, rubs, or gallops PULM: No respiratory  distress.  No crackles, wheezes, or rales. GI: soft, TTP in RUQ and RLQ. No guarding, rebound. + bowel sounds  GU: deferred Neuro: cranial nerves grossly 2-12 intact, no abnormalities of strength or sensation, A and O x 3 MSK: Patient moves all 4 extremities symmetrically, no deformity, edema, or injury noted Skin: No rashes petechiae, purpura, or jaundice Psych: no abnormality of mood   ED Course  Procedures (including critical care time)  Labs Reviewed  URINALYSIS, ROUTINE W REFLEX MICROSCOPIC - Abnormal; Notable for the following:    APPearance TURBID (*)     Hgb  urine dipstick TRACE (*)     All other components within normal limits  URINE MICROSCOPIC-ADD ON - Abnormal; Notable for the following:    Squamous Epithelial / LPF FEW (*)     Bacteria, UA MANY (*)     All other components within normal limits  CBC - Abnormal; Notable for the following:    Hemoglobin 11.3 (*)     HCT 34.5 (*)     All other components within normal limits  COMPREHENSIVE METABOLIC PANEL - Abnormal; Notable for the following:    Glucose, Bld 102 (*)     Total Bilirubin 0.2 (*)     All other components within normal limits  POCT PREGNANCY, URINE  DIFFERENTIAL  LIPASE, BLOOD   Ct Abdomen Pelvis W Contrast  06/30/2011  *RADIOLOGY REPORT*  Clinical Data: Right lower quadrant abdominal pain.  CT ABDOMEN AND PELVIS WITH CONTRAST  Technique:  Multidetector CT imaging of the abdomen and pelvis was performed following the standard protocol during bolus administration of intravenous contrast.  Contrast: OMNIPAQUE IOHEXOL 300 MG/ML  SOLN  Comparison: 11/25/2009  Findings: Trace pleural effusions.  Normal heart size.  The  Low attenuation of the liver suggests fatty infiltration. Distended gallbladder.  No radiodense gallstones.  No biliary ductal dilatation.  Unremarkable spleen, pancreas, adrenal glands.  Symmetric renal enhancement.  No hydronephrosis or hydroureter.  No bowel obstruction.  No CT evidence for colitis.  Normal appendix.  No free intraperitoneal air.  No lymphadenopathy.  Normal caliber vasculature.  Thin-walled bladder.  Multicystic appearance to the right adnexa otherwise unremarkable CT appearance to the uterus and left adnexa. Trace free fluid within the pelvis.  No acute osseous finding.  IMPRESSION: Normal appendix.  Hepatic steatosis.  Distended gallbladder.  No radiodense gallstones.  Multicystic appearance to the right adnexa may reflect physiologic ovarian cysts.  If there is clinical concern for acute pelvic pathology, consider pelvic ultrasound.  Original  Report Authenticated By: Waneta Martins, M.D.     1. Abdominal pain       MDM  Patient was evaluated by myself. Prior to evaluation patient had urinalysis performed. Trace hemoglobin was noted on this with no evidence of urinary tract infection. Urine pregnancy was negative. Patient did not have an acute abdomen on my evaluation. Her symptoms were noted indirect lower and right upper quadrant. Patient still has both gallbladder and an appendix. I have low suspicion for surgical cause for pain today. Patient denied fevers, sick contacts, nausea or vomiting. She reported some loose stools but these were nonbloody. Patient I discussed that she did not have evidence of UTI on urinalysis today. Patient had discussed evaluation with laboratory testing. CBC, CMP, and lipase were performed. Patient was given one dose of IV morphine. She'll be reassessed following laboratory workup for improvement in her symptoms.  4:18 AM Patient noted to have a multiyear ovarian cyst on the right  as well as a distended gallbladder without gallstones on CT. CT was performed after labs returned unremarkable and patient continued to be concerned about her pain. On reassessment patient had soft abdomen. She remained hemodynamically stable. She received 2 doses of morphine. We did discuss that her symptoms may be due to biliary colic and that she has passed the stone. Patient was told that she had to return she should mention that she might need an ultrasound for additional evaluation for this. The referral to surgeon was given at this time given that no gallstones are noted. Patient was discharged 3 tabs of Percocet. She received 2 prior to discharge. Patient understood plan and was discharged in good condition.        Cyndra Numbers, MD 06/30/11 [phone]

## 2011-06-29 NOTE — ED Notes (Signed)
Pt c/o pain in right mid abd x's 1 week.  St's was at Urgent Care earlier today and had lab drawn.  St's they told her it would be tomorrow before results were back.  St's she can't wait that long. Pt is currently taking Cipro for UTI since Mon but is not helping the pain.

## 2011-06-30 ENCOUNTER — Emergency Department (HOSPITAL_COMMUNITY): Payer: Medicaid Other

## 2011-06-30 MED ORDER — OXYCODONE-ACETAMINOPHEN 5-325 MG PO TABS: 2.0000 | ORAL_TABLET | Freq: Four times a day (QID) | ORAL | Status: AC | PRN

## 2011-06-30 MED ORDER — MORPHINE SULFATE 4 MG/ML IJ SOLN
4.0000 mg | Freq: Once | INTRAMUSCULAR | Status: AC
  Administered 2011-06-30: 4 mg via INTRAVENOUS
  Filled 2011-06-30: qty 1

## 2011-06-30 MED ORDER — IOHEXOL 300 MG/ML  SOLN
100.0000 mL | Freq: Once | INTRAMUSCULAR | Status: AC | PRN
  Administered 2011-06-30: 100 mL via INTRAVENOUS

## 2011-06-30 MED ORDER — OXYCODONE-ACETAMINOPHEN 5-325 MG PO TABS: 2.0000 | ORAL_TABLET | Freq: Once | ORAL | Status: DC

## 2011-06-30 MED ORDER — OXYCODONE-ACETAMINOPHEN 5-325 MG PO TABS
2.0000 | ORAL_TABLET | Freq: Once | ORAL | Status: AC
  Administered 2011-06-30: 2 via ORAL
  Filled 2011-06-30: qty 2

## 2011-06-30 MED ORDER — ONDANSETRON HCL 4 MG/2ML IJ SOLN
4.0000 mg | Freq: Once | INTRAMUSCULAR | Status: AC
  Administered 2011-06-30: 4 mg via INTRAVENOUS

## 2011-06-30 MED ORDER — IOHEXOL 300 MG/ML  SOLN
20.0000 mL | INTRAMUSCULAR | Status: AC
  Administered 2011-06-30 (×2): 20 mL via ORAL

## 2011-06-30 MED ORDER — ONDANSETRON HCL 4 MG/2ML IJ SOLN
INTRAMUSCULAR | Status: AC
  Filled 2011-06-30: qty 2

## 2011-06-30 NOTE — ED Notes (Signed)
Informed CT that patient has finished drinking her contrast  

## 2011-06-30 NOTE — ED Notes (Signed)
Pt informed ride needed prior to medications given, pt verbalizes understanding and reports ride available at 7 am. Pt informed no discharge instructions will be given until transportation arrives and inform pt no driving to medications. Pt verbalizes understanding, states she drove here but will await ride home.

## 2011-06-30 NOTE — ED Notes (Signed)
Patient transported to CT 

## 2011-06-30 NOTE — ED Notes (Signed)
Pt drinking PO contrast without difficulty.

## 2011-09-26 ENCOUNTER — Emergency Department (HOSPITAL_COMMUNITY)
Admission: EM | Admit: 2011-09-26 | Discharge: 2011-09-26 | Disposition: A | Discharge status: Home / Self Care | Payer: Medicaid Other | Attending: Emergency Medicine | Admitting: Emergency Medicine

## 2011-09-26 ENCOUNTER — Encounter (HOSPITAL_COMMUNITY): Payer: Self-pay | Admitting: Emergency Medicine

## 2011-09-26 DIAGNOSIS — F419 Anxiety disorder, unspecified: Secondary | ICD-10-CM

## 2011-09-26 DIAGNOSIS — F319 Bipolar disorder, unspecified: Secondary | ICD-10-CM | POA: Insufficient documentation

## 2011-09-26 DIAGNOSIS — F4310 Post-traumatic stress disorder, unspecified: Secondary | ICD-10-CM | POA: Insufficient documentation

## 2011-09-26 DIAGNOSIS — F411 Generalized anxiety disorder: Secondary | ICD-10-CM | POA: Insufficient documentation

## 2011-09-26 DIAGNOSIS — F410 Panic disorder [episodic paroxysmal anxiety] without agoraphobia: Secondary | ICD-10-CM | POA: Insufficient documentation

## 2011-09-26 HISTORY — DX: Post-traumatic stress disorder, unspecified: F43.10

## 2011-09-26 HISTORY — DX: Attention-deficit hyperactivity disorder, unspecified type: F90.9

## 2011-09-26 MED ORDER — LORAZEPAM 0.5 MG PO TABS
0.5000 mg | ORAL_TABLET | Freq: Once | ORAL | Status: AC
  Administered 2011-09-26: 0.5 mg via ORAL
  Filled 2011-09-26: qty 1

## 2011-09-26 MED ORDER — LORAZEPAM 1 MG PO TABS: 0.5000 mg | ORAL_TABLET | Freq: Three times a day (TID) | ORAL | Status: AC | PRN

## 2011-09-26 NOTE — ED Notes (Signed)
[id number]> Expected date:<BR> Expected time:<BR> Means of arrival:<BR> Comments:<BR> *Triage* 31yoF panic attack

## 2011-09-26 NOTE — ED Notes (Signed)
Pt presenting to ed with c/o panic attack pt states she does not know what triggered this. Pt is crying in triage. Pt denies SI at this time. Pt is alert and oriented

## 2011-09-26 NOTE — ED Provider Notes (Signed)
History     CSN: [id number]  Arrival date & time 09/26/11  1518   First MD Initiated Contact with Patient 09/26/11 1723      Chief Complaint  Patient presents with  . Panic Attack    (Consider location/radiation/quality/duration/timing/severity/associated sxs/prior treatment) HPI Comments: 31 year old female presents to the emergency department after having a panic attack around 2:00 this afternoon. She was sitting under a hairdryer in her home and all of a sudden felt very "panicky, tingly, and anxious". The severe panic attack lasted about 30 minutes. She then calls EMS and was brought to the emergency department. She still feels as if she is panicking, however does not as bad as it was at home. She is not sure what triggered this attack. She denies being under any increased stress. No 1 was home with her when this episode happened. She denies ever having a panic attack before. She does have a history of depression and sees Dr. Ladona Ridgel at behavioral health. She takes Prozac for depression. He recently discussed with her symptoms of a panic attack, and she states she feels this is exactly what that was. Admits to associated palpitations and difficulty breathing. Denies any nausea, lightheadedness, dizziness. Denies any suicidal or homicidal ideations.  The history is provided by the patient.    Past Medical History  Diagnosis Date  . Bipolar 1 disorder   . PTSD (post-traumatic stress disorder)   . ADHD (attention deficit hyperactivity disorder)     Past Surgical History  Procedure Date  . Ectopic pregnancy surgery   . Tubal ligation   . Laparoscopy     Family History  Problem Relation Age of Onset  . Cancer Other   . Hypertension Other   . Diabetes Other     History  Substance Use Topics  . Smoking status: Never Smoker   . Smokeless tobacco: Not on file  . Alcohol Use: No    OB History    Grav Para Term Preterm Abortions TAB SAB Ect Mult Living                   Review of Systems  Constitutional: Negative for diaphoresis and appetite change.  HENT: Negative for neck pain and neck stiffness.   Respiratory: Positive for shortness of breath.   Cardiovascular: Positive for palpitations.  Gastrointestinal: Positive for vomiting. Negative for nausea.  Skin: Negative for color change and pallor.  Neurological: Negative for dizziness, weakness and light-headedness.  Psychiatric/Behavioral: Negative for suicidal ideas, confusion and self-injury. The patient is nervous/anxious.     Allergies  Review of patient's allergies indicates no known allergies.  Home Medications   Current Outpatient Rx  Name Route Sig Dispense Refill  . AMPHETAMINE-DEXTROAMPHETAMINE 10 MG PO TABS Oral Take 10 mg by mouth 2 (two) times daily.    Marland Kitchen FLUOXETINE HCL 10 MG PO CAPS Oral Take 10 mg by mouth daily.    . TRAZODONE HCL 50 MG PO TABS Oral Take 100 mg by mouth at bedtime.       BP 127/80  Pulse 74  Temp 98.4 F (36.9 C) (Oral)  Resp 20  SpO2 100%  LMP 09/18/2011  Physical Exam  Nursing note and vitals reviewed. Constitutional: She is oriented to person, place, and time. Vital signs are normal. She appears well-developed and well-nourished. No distress.  HENT:  Head: Normocephalic and atraumatic.  Eyes: Conjunctivae are normal.  Neck: Normal range of motion. Neck supple.  Cardiovascular: Normal rate, regular rhythm and normal heart  sounds.   Pulmonary/Chest: Effort normal and breath sounds normal.  Abdominal: Soft. Bowel sounds are normal.  Musculoskeletal: Normal range of motion.  Neurological: She is alert and oriented to person, place, and time.  Skin: Skin is warm and dry. She is not diaphoretic.  Psychiatric: Her speech is normal and behavior is normal. Thought content normal. Her mood appears anxious. She exhibits a depressed mood.    ED Course  Procedures (including critical care time)  Labs Reviewed - No data to display No results  found.   1. Panic attack   2. Anxiety       MDM  31 year old female with a history of depression presenting with a panic attack. Her symptoms are improving as time goes on. She is in no apparent distress. Give her lorazepam, and advised her to call Dr. Ladona Ridgel tomorrow to discuss a panic attack. Her vitals are stable and she is comfortable with going home.        Trevor Mace, PA-C 09/26/11 1810

## 2011-09-26 NOTE — ED Notes (Signed)
EDPA notified that the patient c/o slight swelling to her left knee and left ankle. Patient denies any injury. Patient states she has had pain x 2 weeks.

## 2011-09-27 ENCOUNTER — Emergency Department (HOSPITAL_COMMUNITY)
Admission: EM | Admit: 2011-09-27 | Discharge: 2011-09-28 | Disposition: A | Discharge status: Home / Self Care | Payer: No Typology Code available for payment source | Attending: Emergency Medicine | Admitting: Emergency Medicine

## 2011-09-27 DIAGNOSIS — R109 Unspecified abdominal pain: Secondary | ICD-10-CM | POA: Insufficient documentation

## 2011-09-27 DIAGNOSIS — R0789 Other chest pain: Secondary | ICD-10-CM | POA: Insufficient documentation

## 2011-09-27 DIAGNOSIS — M25569 Pain in unspecified knee: Secondary | ICD-10-CM | POA: Insufficient documentation

## 2011-09-27 DIAGNOSIS — M5380 Other specified dorsopathies, site unspecified: Secondary | ICD-10-CM | POA: Insufficient documentation

## 2011-09-27 DIAGNOSIS — M542 Cervicalgia: Secondary | ICD-10-CM | POA: Insufficient documentation

## 2011-09-27 DIAGNOSIS — F411 Generalized anxiety disorder: Secondary | ICD-10-CM | POA: Insufficient documentation

## 2011-09-27 DIAGNOSIS — R51 Headache: Secondary | ICD-10-CM | POA: Insufficient documentation

## 2011-09-27 DIAGNOSIS — S99919A Unspecified injury of unspecified ankle, initial encounter: Secondary | ICD-10-CM

## 2011-09-27 DIAGNOSIS — M79609 Pain in unspecified limb: Secondary | ICD-10-CM | POA: Insufficient documentation

## 2011-09-27 DIAGNOSIS — S8990XA Unspecified injury of unspecified lower leg, initial encounter: Secondary | ICD-10-CM | POA: Insufficient documentation
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
## 2011-09-27 NOTE — ED Provider Notes (Signed)
Medical screening examination/treatment/procedure(s) were performed by non-physician practitioner and as supervising physician I was immediately available for consultation/collaboration.   Gwyneth Sprout, MD 09/27/11 1348

## 2011-09-28 ENCOUNTER — Emergency Department (HOSPITAL_COMMUNITY): Payer: No Typology Code available for payment source

## 2011-09-28 LAB — BASIC METABOLIC PANEL
BUN: 9 mg/dL (ref 6–23)
Calcium: 9.4 mg/dL (ref 8.4–10.5)
Creatinine, Ser: 0.52 mg/dL (ref 0.50–1.10)
GFR calc Af Amer: >90 mL/min (ref >90)
GFR calc non Af Amer: >90 mL/min (ref >90)
Glucose, Bld: 101 mg/dL — ABNORMAL HIGH (ref 70–99)
Potassium: 3.7 mEq/L (ref 3.5–5.1)

## 2011-09-28 LAB — TROPONIN I: Troponin I: <0.3 ng/mL (ref <0.30)

## 2011-09-28 LAB — CBC WITH DIFFERENTIAL/PLATELET
Basophils Absolute: 0 10*3/uL (ref 0.0–0.1)
Basophils Relative: 0 % (ref 0–1)
Eosinophils Absolute: 0.2 10*3/uL (ref 0.0–0.7)
Hemoglobin: 11.1 g/dL — ABNORMAL LOW (ref 12.0–15.0)
MCH: 25.9 pg — ABNORMAL LOW (ref 26.0–34.0)
MCHC: 32.3 g/dL (ref 30.0–36.0)
Monocytes Relative: 11 % (ref 3–12)
Neutro Abs: 3.2 10*3/uL (ref 1.7–7.7)
Neutrophils Relative %: 59 % (ref 43–77)
Platelets: 303 10*3/uL (ref 150–400)
RDW: 15.5 % (ref 11.5–15.5)

## 2011-09-28 LAB — ETHANOL: Alcohol, Ethyl (B): <11 mg/dL (ref 0–11)

## 2011-09-28 MED ORDER — LORAZEPAM 1 MG PO TABS
1.0000 mg | ORAL_TABLET | Freq: Once | ORAL | Status: AC
  Administered 2011-09-28: 1 mg via ORAL
  Filled 2011-09-28: qty 1

## 2011-09-28 MED ORDER — ACETAMINOPHEN 325 MG PO TABS: 650.0000 mg | ORAL_TABLET | Freq: Once | ORAL | Status: DC

## 2011-09-28 MED ORDER — OXYCODONE-ACETAMINOPHEN 5-325 MG PO TABS
2.0000 | ORAL_TABLET | Freq: Once | ORAL | Status: AC
  Administered 2011-09-28: 2 via ORAL
  Filled 2011-09-28: qty 2

## 2011-09-28 MED ORDER — HYDROCODONE-ACETAMINOPHEN 5-500 MG PO TABS: 1.0000 | ORAL_TABLET | Freq: Four times a day (QID) | ORAL | Status: AC | PRN
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
## 2011-10-08 NOTE — ED Provider Notes (Signed)
Medical screening examination/treatment/procedure(s) were performed by non-physician practitioner and as supervising physician I was immediately available for consultation/collaboration.   Richardean Canal, MD 10/08/11 1459

## 2011-10-10 ENCOUNTER — Emergency Department (INDEPENDENT_AMBULATORY_CARE_PROVIDER_SITE_OTHER)
Admission: EM | Admit: 2011-10-10 | Discharge: 2011-10-10 | Disposition: A | Discharge status: Home / Self Care | Payer: Medicaid Other | Source: Home / Self Care | Attending: Family Medicine | Admitting: Family Medicine

## 2011-10-10 ENCOUNTER — Encounter (HOSPITAL_COMMUNITY): Payer: Self-pay

## 2011-10-10 DIAGNOSIS — S838X9A Sprain of other specified parts of unspecified knee, initial encounter: Secondary | ICD-10-CM

## 2011-10-10 DIAGNOSIS — S76119A Strain of unspecified quadriceps muscle, fascia and tendon, initial encounter: Secondary | ICD-10-CM

## 2011-10-10 MED ORDER — HYDROCODONE-ACETAMINOPHEN 7.5-325 MG PO TABS: 1.0000 | ORAL_TABLET | Freq: Four times a day (QID) | ORAL | Status: DC | PRN
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
## 2011-10-10 NOTE — ED Notes (Signed)
Per Dr. Blair Heys req called [facility] [phone] to see about a earlier appt, she has a appt scheduled for 11/11/11, Dr Barry Dienes will be seeing her due to her insurance and that is his next available, they would not work her in as she No Showed a 10/05/2011 appt
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
## 2012-02-04 ENCOUNTER — Emergency Department (HOSPITAL_COMMUNITY)
Admission: EM | Admit: 2012-02-04 | Discharge: 2012-02-04 | Disposition: A | Discharge status: Home / Self Care | Payer: Medicaid Other | Attending: Emergency Medicine | Admitting: Emergency Medicine

## 2012-02-04 ENCOUNTER — Encounter (HOSPITAL_COMMUNITY): Payer: Self-pay | Admitting: Emergency Medicine

## 2012-02-04 DIAGNOSIS — M549 Dorsalgia, unspecified: Secondary | ICD-10-CM | POA: Insufficient documentation

## 2012-02-04 DIAGNOSIS — N898 Other specified noninflammatory disorders of vagina: Secondary | ICD-10-CM | POA: Insufficient documentation

## 2012-02-04 DIAGNOSIS — Z3202 Encounter for pregnancy test, result negative: Secondary | ICD-10-CM | POA: Insufficient documentation

## 2012-02-04 DIAGNOSIS — F909 Attention-deficit hyperactivity disorder, unspecified type: Secondary | ICD-10-CM | POA: Insufficient documentation

## 2012-02-04 DIAGNOSIS — N390 Urinary tract infection, site not specified: Secondary | ICD-10-CM | POA: Insufficient documentation

## 2012-02-04 DIAGNOSIS — R300 Dysuria: Secondary | ICD-10-CM | POA: Insufficient documentation

## 2012-02-04 DIAGNOSIS — F319 Bipolar disorder, unspecified: Secondary | ICD-10-CM | POA: Insufficient documentation

## 2012-02-04 DIAGNOSIS — Z8659 Personal history of other mental and behavioral disorders: Secondary | ICD-10-CM | POA: Insufficient documentation

## 2012-02-04 DIAGNOSIS — N3943 Post-void dribbling: Secondary | ICD-10-CM | POA: Insufficient documentation

## 2012-02-04 DIAGNOSIS — Z79899 Other long term (current) drug therapy: Secondary | ICD-10-CM | POA: Insufficient documentation

## 2012-02-04 DIAGNOSIS — Z9889 Other specified postprocedural states: Secondary | ICD-10-CM | POA: Insufficient documentation

## 2012-02-04 DIAGNOSIS — R350 Frequency of micturition: Secondary | ICD-10-CM | POA: Insufficient documentation

## 2012-02-04 DIAGNOSIS — N946 Dysmenorrhea, unspecified: Secondary | ICD-10-CM

## 2012-02-04 LAB — URINALYSIS, ROUTINE W REFLEX MICROSCOPIC
Nitrite: NEGATIVE
Specific Gravity, Urine: 1.019 (ref 1.005–1.030)
Urobilinogen, UA: 0.2 mg/dL (ref 0.0–1.0)
pH: 7 (ref 5.0–8.0)

## 2012-02-04 LAB — URINE MICROSCOPIC-ADD ON

## 2012-02-04 LAB — POCT PREGNANCY, URINE: Preg Test, Ur: NEGATIVE

## 2012-02-04 MED ORDER — TRAMADOL HCL 50 MG PO TABS: 50.0000 mg | ORAL_TABLET | Freq: Four times a day (QID) | ORAL | Status: DC | PRN

## 2012-02-04 MED ORDER — CIPROFLOXACIN HCL 500 MG PO TABS: 500.0000 mg | ORAL_TABLET | Freq: Two times a day (BID) | ORAL | Status: DC

## 2012-02-04 NOTE — ED Provider Notes (Signed)
Medical screening examination/treatment/procedure(s) were performed by non-physician practitioner and as supervising physician I was immediately available for consultation/collaboration.   Flint Melter, MD 02/04/12 [phone]

## 2012-02-04 NOTE — ED Notes (Signed)
Reports right flank pain for 2 weeks ago & lower abdominal pain on the 13th. Denies nausea & vomiting. Stated frequency with voiding but just dribbling.

## 2012-02-04 NOTE — ED Provider Notes (Signed)
History     CSN: [id number]  Arrival date & time 02/04/12  1026   First MD Initiated Contact with Patient 02/04/12 1058      Chief Complaint  Patient presents with  . Abdominal Pain    (Consider location/radiation/quality/duration/timing/severity/associated sxs/prior treatment) HPI Comments: Pt states that she started with right flank pain 2 weeks ago:pt states that she started with lower abdominal pain in the last couple of days:pt states that she was treated by her pcp for a uti about 2 weeks ago:pt states that she is having frequency and dribbling:pt denies vaginal discharge:pt states that she started vaginal bleeding 2 days ago  Patient is a 32 y.o. female presenting with abdominal pain. The history is provided by the patient. No language interpreter was used.  Abdominal Pain The primary symptoms of the illness include abdominal pain and dysuria. The primary symptoms of the illness do not include fever, nausea or vomiting. The current episode started 2 days ago.  The dysuria is associated with frequency.  The patient states that she believes she is currently not pregnant. Additional symptoms associated with the illness include frequency and back pain. Symptoms associated with the illness do not include constipation.    Past Medical History  Diagnosis Date  . Bipolar 1 disorder   . PTSD (post-traumatic stress disorder)   . ADHD (attention deficit hyperactivity disorder)     Past Surgical History  Procedure Date  . Ectopic pregnancy surgery   . Tubal ligation   . Laparoscopy     Family History  Problem Relation Age of Onset  . Cancer Other   . Hypertension Other   . Diabetes Other     History  Substance Use Topics  . Smoking status: Never Smoker   . Smokeless tobacco: Not on file  . Alcohol Use: No    OB History    Grav Para Term Preterm Abortions TAB SAB Ect Mult Living                  Review of Systems  Constitutional: Negative for fever.  Respiratory:  Negative.   Cardiovascular: Negative.   Gastrointestinal: Positive for abdominal pain. Negative for nausea, vomiting and constipation.  Genitourinary: Positive for dysuria and frequency.  Musculoskeletal: Positive for back pain.    Allergies  Penicillins  Home Medications   Current Outpatient Rx  Name  Route  Sig  Dispense  Refill  . AMPHETAMINE-DEXTROAMPHETAMINE 20 MG PO TABS   Oral   Take 20 mg by mouth 2 (two) times daily.         Marland Kitchen FLUOXETINE HCL 10 MG PO CAPS   Oral   Take 10 mg by mouth daily.         Marland Kitchen LORAZEPAM 1 MG PO TABS   Oral   Take 1 mg by mouth every 8 (eight) hours as needed. For anxiety.         . TRAZODONE HCL 50 MG PO TABS   Oral   Take 100 mg by mouth at bedtime as needed. For sleep.           BP 107/70  Pulse 68  Temp 98.4 F (36.9 C) (Oral)  Resp 16  Ht 5\' 5"  (1.651 m)  Wt 180 lb (81.647 kg)  BMI 29.95 kg/m2  SpO2 99%  LMP 02/03/2012  Physical Exam  Vitals reviewed. Constitutional: She is oriented to person, place, and time. She appears well-developed and well-nourished.  HENT:  Head: Normocephalic and atraumatic.  Cardiovascular:  Normal rate and regular rhythm.   Pulmonary/Chest: Effort normal and breath sounds normal.  Abdominal: Soft. Bowel sounds are normal. There is no CVA tenderness.       Suprapubic tenderness  Musculoskeletal: Normal range of motion.  Neurological: She is alert and oriented to person, place, and time.  Skin: Skin is warm and dry.    ED Course  Procedures (including critical care time)  Labs Reviewed  URINALYSIS, ROUTINE W REFLEX MICROSCOPIC - Abnormal; Notable for the following:    Color, Urine RED (*)  BIOCHEMICALS MAY BE AFFECTED BY COLOR   APPearance TURBID (*)     Hgb urine dipstick LARGE (*)     Protein, ur 30 (*)     Leukocytes, UA SMALL (*)     All other components within normal limits  POCT PREGNANCY, URINE  URINE MICROSCOPIC-ADD ON  URINE CULTURE   No results found.   1. UTI  (lower urinary tract infection)   2. Menstrual cramps       MDM  Treated for uti and given based on symptoms and small leukocytes:urine sent for culture:pt given ultram for cramps        Teressa Lower, NP 02/04/12 1634

## 2012-02-05 LAB — URINE CULTURE

## 2012-04-02 ENCOUNTER — Encounter (HOSPITAL_COMMUNITY): Payer: Self-pay

## 2012-04-02 ENCOUNTER — Emergency Department (HOSPITAL_COMMUNITY)
Admission: EM | Admit: 2012-04-02 | Discharge: 2012-04-02 | Disposition: A | Discharge status: Home / Self Care | Payer: Medicaid Other | Attending: Emergency Medicine | Admitting: Emergency Medicine

## 2012-04-02 DIAGNOSIS — R350 Frequency of micturition: Secondary | ICD-10-CM | POA: Insufficient documentation

## 2012-04-02 DIAGNOSIS — R6883 Chills (without fever): Secondary | ICD-10-CM | POA: Insufficient documentation

## 2012-04-02 DIAGNOSIS — F909 Attention-deficit hyperactivity disorder, unspecified type: Secondary | ICD-10-CM | POA: Insufficient documentation

## 2012-04-02 DIAGNOSIS — R109 Unspecified abdominal pain: Secondary | ICD-10-CM | POA: Insufficient documentation

## 2012-04-02 DIAGNOSIS — N390 Urinary tract infection, site not specified: Secondary | ICD-10-CM | POA: Insufficient documentation

## 2012-04-02 DIAGNOSIS — F319 Bipolar disorder, unspecified: Secondary | ICD-10-CM | POA: Insufficient documentation

## 2012-04-02 DIAGNOSIS — R110 Nausea: Secondary | ICD-10-CM | POA: Insufficient documentation

## 2012-04-02 DIAGNOSIS — Z9851 Tubal ligation status: Secondary | ICD-10-CM | POA: Insufficient documentation

## 2012-04-02 DIAGNOSIS — Z792 Long term (current) use of antibiotics: Secondary | ICD-10-CM | POA: Insufficient documentation

## 2012-04-02 DIAGNOSIS — R3915 Urgency of urination: Secondary | ICD-10-CM | POA: Insufficient documentation

## 2012-04-02 DIAGNOSIS — R3911 Hesitancy of micturition: Secondary | ICD-10-CM | POA: Insufficient documentation

## 2012-04-02 DIAGNOSIS — Z79899 Other long term (current) drug therapy: Secondary | ICD-10-CM | POA: Insufficient documentation

## 2012-04-02 DIAGNOSIS — Z8659 Personal history of other mental and behavioral disorders: Secondary | ICD-10-CM | POA: Insufficient documentation

## 2012-04-02 DIAGNOSIS — R42 Dizziness and giddiness: Secondary | ICD-10-CM | POA: Insufficient documentation

## 2012-04-02 HISTORY — DX: Urinary tract infection, site not specified: N39.0

## 2012-04-02 LAB — URINALYSIS, ROUTINE W REFLEX MICROSCOPIC
Bilirubin Urine: NEGATIVE
Glucose, UA: NEGATIVE mg/dL
Ketones, ur: NEGATIVE mg/dL
pH: 5.5 (ref 5.0–8.0)

## 2012-04-02 LAB — COMPREHENSIVE METABOLIC PANEL
ALT: 9 U/L (ref 0–35)
Albumin: 3.7 g/dL (ref 3.5–5.2)
Alkaline Phosphatase: 66 U/L (ref 39–117)
Chloride: 104 mEq/L (ref 96–112)
Glucose, Bld: 83 mg/dL (ref 70–99)
Potassium: 4.2 mEq/L (ref 3.5–5.1)
Sodium: 139 mEq/L (ref 135–145)
Total Bilirubin: 0.2 mg/dL — ABNORMAL LOW (ref 0.3–1.2)
Total Protein: 7.3 g/dL (ref 6.0–8.3)

## 2012-04-02 LAB — CBC WITH DIFFERENTIAL/PLATELET
Basophils Relative: 0 % (ref 0–1)
Eosinophils Absolute: 0.1 10*3/uL (ref 0.0–0.7)
Hemoglobin: 11.9 g/dL — ABNORMAL LOW (ref 12.0–15.0)
Lymphs Abs: 2 10*3/uL (ref 0.7–4.0)
MCH: 26.6 pg (ref 26.0–34.0)
Neutro Abs: 4 10*3/uL (ref 1.7–7.7)
Neutrophils Relative %: 59 % (ref 43–77)
Platelets: 321 10*3/uL (ref 150–400)
RBC: 4.48 MIL/uL (ref 3.87–5.11)

## 2012-04-02 LAB — URINE MICROSCOPIC-ADD ON

## 2012-04-02 MED ORDER — KETOROLAC TROMETHAMINE 30 MG/ML IJ SOLN
30.0000 mg | Freq: Once | INTRAMUSCULAR | Status: AC
  Administered 2012-04-02: 30 mg via INTRAVENOUS
  Filled 2012-04-02: qty 1

## 2012-04-02 MED ORDER — SODIUM CHLORIDE 0.9 % IV SOLN
Freq: Once | INTRAVENOUS | Status: AC
  Administered 2012-04-02: 13:00:00 via INTRAVENOUS

## 2012-04-02 MED ORDER — SODIUM CHLORIDE 0.9 % IV BOLUS (SEPSIS)
500.0000 mL | Freq: Once | INTRAVENOUS | Status: AC
  Administered 2012-04-02: 500 mL via INTRAVENOUS

## 2012-04-02 MED ORDER — CIPROFLOXACIN HCL 500 MG PO TABS: 500.0000 mg | ORAL_TABLET | Freq: Two times a day (BID) | ORAL | Status: DC

## 2012-04-02 MED ORDER — IBUPROFEN 800 MG PO TABS: 800.0000 mg | ORAL_TABLET | Freq: Three times a day (TID) | ORAL | Status: DC

## 2012-04-02 NOTE — ED Provider Notes (Signed)
History     CSN: [id number]  Arrival date & time 04/02/12  1052   First MD Initiated Contact with Patient 04/02/12 1111      Chief Complaint  Patient presents with  . Dysuria    (Consider location/radiation/quality/duration/timing/severity/associated sxs/prior treatment) Patient is a 32 y.o. female presenting with dysuria.  Dysuria  Associated symptoms include chills, nausea, frequency, urgency and flank pain. Pertinent negatives include no vomiting and no hematuria.   Patient is a 32 yo F with dysuria x 4 mo. Patient decided to come in today because the pain was at its worst. Patient has been seen by other providers. Patient was has been on antibiotics in the past four months for this without resolve. Has taken all prescriptions to completion. Now on Amoxicillin with two pills left. Patient has lower abdominal pain radiating to right and left flanks. Constant sharp pain. No alleviating factors. Laying on right side, taking deep breaths, and urinating increase pain. 10/10 at its worse. 10/10 right now. Patient has chills, N, frequency, urgency, and hesitancy. Patient denies fevers, V, hematuria, pregnancy, no concern for STI, sexually active does not use barrier protection, no history of kidney stones.   Past Medical History  Diagnosis Date  . Bipolar 1 disorder   . PTSD (post-traumatic stress disorder)   . ADHD (attention deficit hyperactivity disorder)   . UTI (lower urinary tract infection)     Past Surgical History  Procedure Laterality Date  . Ectopic pregnancy surgery    . Tubal ligation    . Laparoscopy      Family History  Problem Relation Age of Onset  . Cancer Other   . Hypertension Other   . Diabetes Other     History  Substance Use Topics  . Smoking status: Never Smoker   . Smokeless tobacco: Never Used  . Alcohol Use: No    OB History   Grav Para Term Preterm Abortions TAB SAB Ect Mult Living                  Review of Systems  Constitutional:  Positive for chills. Negative for fever.  Respiratory: Negative for shortness of breath.   Gastrointestinal: Positive for nausea and abdominal pain. Negative for vomiting.       No changes in BM. Has hemorrhoids.   Genitourinary: Positive for dysuria, urgency, frequency, flank pain and difficulty urinating. Negative for hematuria, vaginal bleeding, vaginal discharge and pelvic pain.  Musculoskeletal: Negative for back pain.  Neurological: Positive for dizziness and light-headedness.    Allergies  Penicillins  Home Medications   Current Outpatient Rx  Name  Route  Sig  Dispense  Refill  . amoxicillin (AMOXIL) 500 MG capsule   Oral   Take 500 mg by mouth 3 (three) times daily.         Marland Kitchen amphetamine-dextroamphetamine (ADDERALL) 20 MG tablet   Oral   Take 20 mg by mouth 2 (two) times daily.         Marland Kitchen FLUoxetine (PROZAC) 10 MG capsule   Oral   Take 20 mg by mouth daily.          Marland Kitchen LORazepam (ATIVAN) 1 MG tablet   Oral   Take 1 mg by mouth every 8 (eight) hours as needed. For anxiety.         . traZODone (DESYREL) 50 MG tablet   Oral   Take 100 mg by mouth at bedtime as needed. For sleep.  BP 114/67  Pulse 66  Temp(Src) 98.4 F (36.9 C) (Oral)  Resp 18  SpO2 98%  LMP 04/02/2012  Physical Exam  Constitutional: She is oriented to person, place, and time. She appears well-developed and well-nourished.  HENT:  Head: Normocephalic and atraumatic.  Neck: Neck supple.  Cardiovascular: Normal rate, regular rhythm and normal heart sounds.   Pulmonary/Chest: Effort normal and breath sounds normal.  Abdominal: Soft. Bowel sounds are normal. She exhibits no distension and no mass. There is tenderness in the right lower quadrant, suprapubic area and left lower quadrant. There is CVA tenderness. There is no rigidity, no rebound, no guarding and no tenderness at McBurney's point.  Bilateral CVA tenderness  Lymphadenopathy:    She has no cervical adenopathy.   Neurological: She is alert and oriented to person, place, and time.  Skin: Skin is warm and dry.    ED Course  Procedures (including critical care time)  Labs Reviewed  CBC WITH DIFFERENTIAL - Abnormal; Notable for the following:    Hemoglobin 11.9 (*)    All other components within normal limits  COMPREHENSIVE METABOLIC PANEL - Abnormal; Notable for the following:    Total Bilirubin 0.2 (*)    All other components within normal limits  URINALYSIS, ROUTINE W REFLEX MICROSCOPIC - Abnormal; Notable for the following:    APPearance CLOUDY (*)    Hgb urine dipstick MODERATE (*)    Leukocytes, UA TRACE (*)    All other components within normal limits  URINE MICROSCOPIC-ADD ON - Abnormal; Notable for the following:    Squamous Epithelial / LPF FEW (*)    All other components within normal limits   No results found.   1. UTI (urinary tract infection)       MDM   Joy Pittman is a 32 y.o. female who complains of urinary frequency, urgency and dysuria x 4 months, with flank pain, subjective fever, chills, nausea and abdominal pain. Patient denies hematuria, vomiting, vaginal discharge, and pelvic pain. Appears well, in no apparent distress.  Vital signs are normal. The abdomen is soft without guarding, mass, rebound or organomegaly and normal BS present. Patient had right lower, left lower, and suprapubic quadrant tenderness, bilateral CVA tenderness. Urine dipstick shows positive for RBC's and positive for trace leukocytes.  Micro exam: 0-2 WBC's per HPF. Patient was diagnosed with UTI uncomplicated without evidence of pyelonephritis. Patient was discharged on Cipro as an outpatient and advised to follow up with PCP to set up possible appointment with urologist for further evaluation.   Jeannetta Ellis, PA-C 04/02/12 1620

## 2012-04-02 NOTE — ED Notes (Signed)
Patient states that she has dysuria. Patient states that she has been having UTI symptoms since Dec/2013. Patient states she has been on several antibiotics  Prescribed by PCP and symptoms continue with no relief.

## 2012-04-03 NOTE — ED Provider Notes (Signed)
Medical screening examination/treatment/procedure(s) were performed by non-physician practitioner and as supervising physician I was immediately available for consultation/collaboration.  Geoffery Lyons, MD 04/03/12 [phone]

## 2012-04-19 ENCOUNTER — Other Ambulatory Visit (HOSPITAL_COMMUNITY)
Admission: RE | Admit: 2012-04-19 | Discharge: 2012-04-19 | Disposition: A | Discharge status: Home / Self Care | Payer: Medicaid Other | Source: Ambulatory Visit | Attending: Medical | Admitting: Medical

## 2012-04-19 ENCOUNTER — Encounter: Payer: Self-pay | Admitting: Family

## 2012-04-19 ENCOUNTER — Ambulatory Visit (INDEPENDENT_AMBULATORY_CARE_PROVIDER_SITE_OTHER): Payer: Medicaid Other | Admitting: Family

## 2012-04-19 VITALS — BP 111/77 | HR 85 | Temp 97.3°F | Ht 66.0 in | Wt 218.4 lb

## 2012-04-19 DIAGNOSIS — Z01419 Encounter for gynecological examination (general) (routine) without abnormal findings: Secondary | ICD-10-CM

## 2012-04-19 DIAGNOSIS — R102 Pelvic and perineal pain: Secondary | ICD-10-CM | POA: Insufficient documentation

## 2012-04-19 DIAGNOSIS — Z113 Encounter for screening for infections with a predominantly sexual mode of transmission: Secondary | ICD-10-CM | POA: Insufficient documentation

## 2012-04-19 DIAGNOSIS — F319 Bipolar disorder, unspecified: Secondary | ICD-10-CM | POA: Insufficient documentation

## 2012-04-19 DIAGNOSIS — F4310 Post-traumatic stress disorder, unspecified: Secondary | ICD-10-CM

## 2012-04-19 DIAGNOSIS — Z1151 Encounter for screening for human papillomavirus (HPV): Secondary | ICD-10-CM | POA: Insufficient documentation

## 2012-04-19 DIAGNOSIS — N949 Unspecified condition associated with female genital organs and menstrual cycle: Secondary | ICD-10-CM

## 2012-04-19 LAB — POCT URINALYSIS DIP (DEVICE)
Glucose, UA: NEGATIVE mg/dL
Nitrite: NEGATIVE
Protein, ur: NEGATIVE mg/dL
Urobilinogen, UA: 0.2 mg/dL (ref 0.0–1.0)

## 2012-04-19 MED ORDER — SULFAMETHOXAZOLE-TRIMETHOPRIM 800-160 MG PO TABS: 1.0000 | ORAL_TABLET | Freq: Two times a day (BID) | ORAL | Status: DC

## 2012-04-19 MED ORDER — FLUCONAZOLE 150 MG PO TABS: 150.0000 mg | ORAL_TABLET | Freq: Once | ORAL | Status: DC

## 2012-04-19 NOTE — Progress Notes (Signed)
Patient c/o frequent UTI's.  Has been followed by PCP.  Patient is unsure why she continues to get UTI's and what she can do about it. Patient also reports burning, itching, and irritation.

## 2012-04-19 NOTE — Progress Notes (Signed)
Subjective:     Joy Pittman is a 32 y.o. female here for a routine exam.  Current complaints: dysuria intermittently x 4 months.  Treated by PCP approximately three times.  Reports thick white vaginal discharge with itching. No vaginal odor.  Personal health questionnaire reviewed: yes.  Pt reports history of ovarian cysts.  Reports lower pelvic pain, uncertain if it is related to UTIs or ovarian cysts.     Gynecologic History Patient's last menstrual period was 04/02/2012. Contraception: tubal ligation Last Pap: March 2013. Results were: normal Last mammogram: Never. Results were:   Obstetric History OB History   Grav Para Term Preterm Abortions TAB SAB Ect Mult Living   4 3 3  1   1  3      # Outc Date GA Lbr Len/2nd Wgt Sex Del Anes PTL Lv   1 TRM 12/97 [redacted]w[redacted]d   M SVD      2 TRM 11/00 [redacted]w[redacted]d   M SVD      3 TRM 7/02 [redacted]w[redacted]d   F SVD      4 ECT                The following portions of the patient's history were reviewed and updated as appropriate: allergies, current medications, past family history, past medical history, past social history, past surgical history and problem list.  Review of Systems Pertinent items are noted in HPI.    Objective:    BP 111/77  Pulse 85  Temp(Src) 97.3 F (36.3 C) (Oral)  Ht 5\' 6"  (1.676 m)  Wt 218 lb 6.4 oz (99.066 kg)  BMI 35.27 kg/m2  LMP 04/02/2012  General Appearance:    Alert, cooperative, no distress, appears stated age  Head:    Normocephalic, without obvious abnormality, atraumatic  Eyes:    PERRL, conjunctiva/corneas clear, EOM's intact, fundi    benign, both eyes  Ears:    Normal TM's and external ear canals, both ears  Nose:   Nares normal, septum midline, mucosa normal, no drainage    or sinus tenderness  Throat:   Lips, mucosa, and tongue normal; teeth and gums normal  Neck:   Supple, symmetrical, trachea midline, no adenopathy;    thyroid:  no enlargement/tenderness/nodules; no carotid   bruit or JVD  Back:      Symmetric, no curvature, ROM normal, no CVA tenderness  Lungs:     Clear to auscultation bilaterally, respirations unlabored  Chest Wall:    No tenderness or deformity   Heart:    Regular rate and rhythm, S1 and S2 normal, no murmur, rub   or gallop  Breast Exam:    No tenderness, masses, or nipple abnormality  Abdomen:     Soft, non-tender, bowel sounds active all four quadrants,    no masses, no organomegaly  Genitalia:    Normal female without lesion, +white discharge     Extremities:   Extremities normal, atraumatic, no cyanosis or edema  Pulses:   2+ and symmetric all extremities  Skin:   Skin color, texture, turgor normal, no rashes or lesions  Lymph nodes:   Cervical, supraclavicular, and axillary nodes normal  Neurologic:   CNII-XII intact, normal strength, sensation and reflexes    throughout     Results for orders placed in visit on 04/19/12 (from the past 24 hour(s))  POCT URINALYSIS DIP (DEVICE)     Status: Abnormal   Collection Time    04/19/12  2:38 PM  Result Value Range   Glucose, UA NEGATIVE  NEGATIVE mg/dL   Bilirubin Urine NEGATIVE  NEGATIVE   Ketones, ur NEGATIVE  NEGATIVE mg/dL   Specific Gravity, Urine >=1.030  1.005 - 1.030   Hgb urine dipstick SMALL (*) NEGATIVE   pH 6.5  5.0 - 8.0   Protein, ur NEGATIVE  NEGATIVE mg/dL   Urobilinogen, UA 0.2  0.0 - 1.0 mg/dL   Nitrite NEGATIVE  NEGATIVE   Leukocytes, UA LARGE (*) NEGATIVE    Assessment:   Well Woman Exam Dysuria Vaginal Discharge.    Plan:    Urine Culture RX Bactrim DS i po BID x 3 days and Diflucan 150 mg po Wet prep and pap smear to lab. Schedule pelvic ultrasound to assess ovarian cysts

## 2012-04-20 LAB — WET PREP, GENITAL: Trich, Wet Prep: NONE SEEN

## 2012-04-22 ENCOUNTER — Other Ambulatory Visit (HOSPITAL_COMMUNITY): Payer: Self-pay | Admitting: Family

## 2012-04-22 ENCOUNTER — Other Ambulatory Visit: Payer: Self-pay | Admitting: Family

## 2012-04-22 MED ORDER — METRONIDAZOLE 500 MG PO TABS: 500.0000 mg | ORAL_TABLET | Freq: Two times a day (BID) | ORAL | Status: DC

## 2012-04-23 ENCOUNTER — Telehealth: Payer: Self-pay | Admitting: General Practice

## 2012-04-23 NOTE — Telephone Encounter (Signed)
Called patient and informed her of results and that she will have to take Flagyl, an antibiotic, twice a day for a week and that Rx was sent to [facility] on [address]. Patient verbalized understanding and had no further questions

## 2012-04-23 NOTE — Telephone Encounter (Signed)
Message copied by Kathee Delton on Mon Apr 23, 2012  4:18 PM ------      Message from: Melissa Noon      Created: Sun Apr 22, 2012  6:57 AM      Regarding: Need Flagyl       Please call pt and notify regarding yeast and clue.  Need pharmacy to send to.  Already given Diflucan, need flagyl.            ----- Message -----         From: [facility] Interface         Sent: 04/20/2012   8:01 AM           To: Melissa Noon, CNM                   ------

## 2012-04-24 ENCOUNTER — Ambulatory Visit (HOSPITAL_COMMUNITY)
Admission: RE | Admit: 2012-04-24 | Discharge: 2012-04-24 | Disposition: A | Discharge status: Home / Self Care | Payer: Medicaid Other | Source: Ambulatory Visit | Attending: Family | Admitting: Family

## 2012-04-24 ENCOUNTER — Telehealth: Payer: Self-pay | Admitting: *Deleted

## 2012-04-24 DIAGNOSIS — R102 Pelvic and perineal pain: Secondary | ICD-10-CM

## 2012-04-24 DIAGNOSIS — R1032 Left lower quadrant pain: Secondary | ICD-10-CM | POA: Insufficient documentation

## 2012-04-24 DIAGNOSIS — N949 Unspecified condition associated with female genital organs and menstrual cycle: Secondary | ICD-10-CM | POA: Insufficient documentation

## 2012-04-24 DIAGNOSIS — D252 Subserosal leiomyoma of uterus: Secondary | ICD-10-CM | POA: Insufficient documentation

## 2012-04-24 MED ORDER — METRONIDAZOLE 500 MG PO TABS: 500.0000 mg | ORAL_TABLET | Freq: Two times a day (BID) | ORAL | Status: DC

## 2012-04-24 NOTE — Telephone Encounter (Signed)
Pt left message stating that her pharmacy did not have a Rx waiting for her yesterday. I reviewed medication list and observed that the Rx for flagyl did not get e-prescribed- printed instead. I sent Rx electronically and called pt to inform her that she can pick it up today. Pt voiced understanding.

## 2012-04-25 ENCOUNTER — Telehealth: Payer: Self-pay | Admitting: Obstetrics and Gynecology

## 2012-04-25 NOTE — Telephone Encounter (Addendum)
Message copied by Toula Moos on Wed Apr 25, 2012  8:35 AM  ------CALLED PATIENT and notified of note below. Made appt on 4/30 @ 2pm. Patient agrees and satisfied.         Message from: Melissa Noon      Created: Wed Apr 25, 2012  8:02 AM       Pt needs to come in for GC/CT screen due to ultrasound result of possible right hydrosalpinx and further treatment.            ----- Message -----         From: Rad Results In Interface         Sent: 04/24/2012   3:29 PM           To: Melissa Noon, CNM                   ------

## 2012-04-26 ENCOUNTER — Telehealth: Payer: Self-pay

## 2012-04-26 NOTE — Telephone Encounter (Signed)
Patient called requesting pain medication.

## 2012-04-27 ENCOUNTER — Telehealth: Payer: Self-pay

## 2012-04-27 NOTE — Telephone Encounter (Signed)
Patient called and left a VM stating she wants some pain medication.  She was seen last week.

## 2012-04-29 ENCOUNTER — Other Ambulatory Visit: Payer: Self-pay | Admitting: Family

## 2012-04-29 DIAGNOSIS — R102 Pelvic and perineal pain: Secondary | ICD-10-CM

## 2012-04-29 MED ORDER — DOXYCYCLINE HYCLATE 100 MG PO CAPS: 100.0000 mg | ORAL_CAPSULE | Freq: Two times a day (BID) | ORAL | Status: DC

## 2012-04-29 NOTE — Progress Notes (Signed)
Consulted with Dr. Penne Lash regarding pt ultrasound result > RX Doxycycline x 2 weeks; follow-up on pain in two weeks at scheduled appt.  Pt called to inform regarding RX called in to preferred pharmacy > no answer.  Left message to call office.  Pt also needs to come in prior to 4/30 for urine GC/CT.

## 2012-04-30 NOTE — Telephone Encounter (Signed)
Called pt and pain informed me that she was prescribed antibiotic for UTI for x 2 weeks but she needed to leave a urine in which I informed that she could come in today come to front desk and leave a urine sample and we would send for urine culture.  I advised her for the pain the use tylenol 500 mg take 2 not to exceed 4000mg  a day or ibuprofen 800mg  q 8 hours as needed for pain until her appt scheduled for 05/16/12.  At that appt if her pain continues it will be evaluated then. Pt stated understanding and did not have any further questions.

## 2012-05-16 ENCOUNTER — Ambulatory Visit (INDEPENDENT_AMBULATORY_CARE_PROVIDER_SITE_OTHER): Payer: Medicaid Other | Admitting: Obstetrics & Gynecology

## 2012-05-16 ENCOUNTER — Encounter: Payer: Self-pay | Admitting: Obstetrics & Gynecology

## 2012-05-16 VITALS — BP 106/77 | HR 73 | Temp 97.6°F | Ht 66.0 in | Wt 216.1 lb

## 2012-05-16 DIAGNOSIS — N949 Unspecified condition associated with female genital organs and menstrual cycle: Secondary | ICD-10-CM

## 2012-05-16 DIAGNOSIS — R102 Pelvic and perineal pain: Secondary | ICD-10-CM

## 2012-05-16 MED ORDER — DICLOFENAC POTASSIUM 50 MG PO TABS: 50.0000 mg | ORAL_TABLET | Freq: Three times a day (TID) | ORAL | Status: DC

## 2012-05-16 NOTE — Progress Notes (Signed)
Subjective:     Patient ID: Joy Pittman, female   DOB: 07/11/1980, 32 y.o.   MRN: [id number]  HPI Pt c/o pelvic pain.  Was seen prev and told to f/u after antibiotic that she took for 2 weeks.  She denies change in the pain over the past several years except that it is more persistent.   She previously has a pelviscopy for the pain and scar tissue was noted.  She is not worried about an STI as she has had no discharge and has been monogamous.  Past Medical History  Diagnosis Date  . Bipolar 1 disorder   . PTSD (post-traumatic stress disorder)   . ADHD (attention deficit hyperactivity disorder)   . UTI (lower urinary tract infection)    Past Surgical History  Procedure Laterality Date  . Ectopic pregnancy surgery    . Tubal ligation    . Laparoscopy        Review of Systems     Objective:   Physical ExamPt in NAD BP 106/77  Pulse 73  Temp(Src) 97.6 F (36.4 C) (Oral)  Ht 5\' 6"  (1.676 m)  Wt 216 lb 1.6 oz (98.022 kg)  BMI 34.9 kg/m2  LMP 05/03/2012 Lungs: CTA CV: RRR Abd: soft, NT, ND GU: EGBUS: no lesions Vagina: no blood in vault Cervix: no lesion; no CMT Uterus: small, mobile Adnexa: no masses; non tender   04/2012 Findings:  Uterus: 9.3 x 4.6 x 4.6 cm.A single small subserosal fibroid is  seen in the right posterior fundal region measuring 1.3 cm in  maximum diameter.  Endometrium: Double layer thickness measures 10 mm transvaginally.  No focal lesion visualized.  Right ovary: 3.2 x 2.1 x 2.3 cm. Normal appearance. No adnexal  mass identified. A small tubular structure noted in the right  adnexa which shows no evidence of internal blood flow on color  Doppler ultrasound. This is suspicious for a small right  hydrosalpinx.  Left ovary: 3.1 x 1.3 x 2.2 cm. Normal appearance. No adnexal  mass identified.  Other Findings: No free fluid.  IMPRESSION:  1. 1.3 cm subserosal fibroid in the right fundal region.  2. Normal ovaries.  3. Probable small right  hydrosalpinx.       Assessment:     Pelvic pain suspect due to scar tissue.  D/W pt treatmenet options including laparoscopy to eval again, PT and NSAIDS.  Pt would like to cont with trial of pain meds.      Plan:     Cataflam 50 mg q 8 hours prn F/u 6 months or sooner prn

## 2012-05-16 NOTE — Patient Instructions (Addendum)
Pelvic Pain Pelvic pain is pain below the belly button and located between your hips. Acute pain may last a few hours or days. Chronic pelvic pain may last weeks and months. The cause may be different for different types of pain. The pain may be dull or sharp, mild or severe and can interfere with your daily activities. Write down and tell your caregiver:   Exactly where the pain is located.  If it comes and goes or is there all the time.  When it happens (with sex, urination, bowel movement, etc.)  If the pain is related to your menstrual period or stress. Your caregiver will take a full history and do a complete physical exam and Pap test. CAUSES   Painful menstrual periods (dysmenorrhea).  Normal ovulation (Mittelschmertz) that occurs in the middle of the menstrual cycle every month.  The pelvic organs get engorged with blood just before the menstrual period (pelvic congestive syndrome).  Scar tissue from an infection or past surgery (pelvic adhesions).  Cancer of the female pelvic organs. When there is pain with cancer, it has been there for a long time.  The lining of the uterus (endometrium) abnormally grows in places like the pelvis and on the pelvic organs (endometriosis).  A form of endometriosis with the lining of the uterus present inside of the muscle tissue of the uterus (adenomyosis).  Fibroid tumor (noncancerous) in the uterus.  Bladder problems such as infection, bladder spasms of the muscle tissue of the bladder.  Intestinal problems (irritable bowel syndrome, colitis, an ulcer or gastrointestinal infection).  Polyps of the cervix or uterus.  Pregnancy in the tube (ectopic pregnancy).  The opening of the cervix is too small for the menstrual blood to flow through it (cervical stenosis).  Physical or sexual abuse (past or present).  Musculo-skeletal problems from poor posture, problems with the vertebrae of the lower back or the uterine pelvic muscles falling  (prolapse).  Psychological problems such as depression or stress.  IUD (intrauterine device) in the uterus. DIAGNOSIS  Tests to make a diagnosis depends on the type, location, severity and what causes the pain to occur. Tests that may be needed include:  Blood tests.  Urine tests  Ultrasound.  X-rays.  CT Scan.  MRI.  Laparoscopy.  Major surgery. TREATMENT  Treatment will depend on the cause of the pain, which includes:  Prescription or over-the-counter pain medication.  Antibiotics.  Birth control pills.  Hormone treatment.  Nerve blocking injections.  Physical therapy.  Antidepressants.  Counseling with a psychiatrist or psychologist.  Minor or major surgery. HOME CARE INSTRUCTIONS   Only take over-the-counter or prescription medicines for pain, discomfort or fever as directed by your caregiver.  Follow your caregiver's advice to treat your pain.  Rest.  Avoid sexual intercourse if it causes the pain.  Apply warm or cold compresses (which ever works best) to the pain area.  Do relaxation exercises such as yoga or meditation.  Try acupuncture.  Avoid stressful situations.  Try group therapy.  If the pain is because of a stomach/intestinal upset, drink clear liquids, eat a bland light food diet until the symptoms go away. SEEK MEDICAL CARE IF:   You need stronger prescription pain medication.  You develop pain with sexual intercourse.  You have pain with urination.  You develop a temperature of 102 F (38.9 C) with the pain.  You are still in pain after 4 hours of taking prescription medication for the pain.  You need depression medication.    Your IUD is causing pain and you want it removed. SEEK IMMEDIATE MEDICAL CARE IF:  You develop very severe pain or tenderness.  You faint, have chills, severe weakness or dehydration.  You develop heavy vaginal bleeding or passing solid tissue.  You develop a temperature of 102 F (38.9 C)  with the pain.  You have blood in the urine.  You are being physically or sexually abused.  You have uncontrolled vomiting and diarrhea.  You are depressed and afraid of harming yourself or someone else. Document Released: 02/11/2004 Document Revised: 03/28/2011 Document Reviewed: 11/08/2007 [employer] Patient Information 2013 [employer].  

## 2012-06-13 ENCOUNTER — Emergency Department (HOSPITAL_COMMUNITY)
Admission: EM | Admit: 2012-06-13 | Discharge: 2012-06-13 | Disposition: A | Discharge status: Home / Self Care | Payer: Medicaid Other | Attending: Emergency Medicine | Admitting: Emergency Medicine

## 2012-06-13 ENCOUNTER — Encounter (HOSPITAL_COMMUNITY): Payer: Self-pay | Admitting: *Deleted

## 2012-06-13 ENCOUNTER — Emergency Department (HOSPITAL_COMMUNITY): Payer: Medicaid Other

## 2012-06-13 DIAGNOSIS — Z791 Long term (current) use of non-steroidal anti-inflammatories (NSAID): Secondary | ICD-10-CM | POA: Insufficient documentation

## 2012-06-13 DIAGNOSIS — Z880 Allergy status to penicillin: Secondary | ICD-10-CM | POA: Insufficient documentation

## 2012-06-13 DIAGNOSIS — K625 Hemorrhage of anus and rectum: Secondary | ICD-10-CM | POA: Insufficient documentation

## 2012-06-13 DIAGNOSIS — R079 Chest pain, unspecified: Secondary | ICD-10-CM

## 2012-06-13 DIAGNOSIS — Z79899 Other long term (current) drug therapy: Secondary | ICD-10-CM | POA: Insufficient documentation

## 2012-06-13 DIAGNOSIS — N390 Urinary tract infection, site not specified: Secondary | ICD-10-CM | POA: Insufficient documentation

## 2012-06-13 DIAGNOSIS — R109 Unspecified abdominal pain: Secondary | ICD-10-CM | POA: Insufficient documentation

## 2012-06-13 DIAGNOSIS — F909 Attention-deficit hyperactivity disorder, unspecified type: Secondary | ICD-10-CM | POA: Insufficient documentation

## 2012-06-13 DIAGNOSIS — Z8659 Personal history of other mental and behavioral disorders: Secondary | ICD-10-CM | POA: Insufficient documentation

## 2012-06-13 DIAGNOSIS — F319 Bipolar disorder, unspecified: Secondary | ICD-10-CM | POA: Insufficient documentation

## 2012-06-13 DIAGNOSIS — R0789 Other chest pain: Secondary | ICD-10-CM | POA: Insufficient documentation

## 2012-06-13 LAB — CBC WITH DIFFERENTIAL/PLATELET
Basophils Absolute: 0 10*3/uL (ref 0.0–0.1)
Basophils Relative: 0 % (ref 0–1)
Eosinophils Absolute: 0.1 10*3/uL (ref 0.0–0.7)
Eosinophils Relative: 2 % (ref 0–5)
MCH: 26.5 pg (ref 26.0–34.0)
MCHC: 32.8 g/dL (ref 30.0–36.0)
MCV: 80.8 fL (ref 78.0–100.0)
Neutrophils Relative %: 60 % (ref 43–77)
Platelets: 289 10*3/uL (ref 150–400)
RDW: 14.9 % (ref 11.5–15.5)

## 2012-06-13 LAB — COMPREHENSIVE METABOLIC PANEL
ALT: 8 U/L (ref 0–35)
Alkaline Phosphatase: 75 U/L (ref 39–117)
BUN: 7 mg/dL (ref 6–23)
CO2: 25 mEq/L (ref 19–32)
Calcium: 9.6 mg/dL (ref 8.4–10.5)
GFR calc Af Amer: >90 mL/min (ref >90)
GFR calc non Af Amer: >90 mL/min (ref >90)
Glucose, Bld: 92 mg/dL (ref 70–99)
Sodium: 138 mEq/L (ref 135–145)
Total Protein: 7.6 g/dL (ref 6.0–8.3)

## 2012-06-13 LAB — URINALYSIS, ROUTINE W REFLEX MICROSCOPIC
Bilirubin Urine: NEGATIVE
Nitrite: NEGATIVE
Protein, ur: NEGATIVE mg/dL
Specific Gravity, Urine: 1.027 (ref 1.005–1.030)
Urobilinogen, UA: 0.2 mg/dL (ref 0.0–1.0)

## 2012-06-13 LAB — URINE MICROSCOPIC-ADD ON

## 2012-06-13 LAB — POCT I-STAT TROPONIN I: Troponin i, poc: 0.01 ng/mL (ref 0.00–0.08)

## 2012-06-13 LAB — LIPASE, BLOOD: Lipase: 23 U/L (ref 11–59)

## 2012-06-13 LAB — OCCULT BLOOD, POC DEVICE: Fecal Occult Bld: POSITIVE — AB

## 2012-06-13 MED ORDER — SODIUM CHLORIDE 0.9 % IV SOLN: 1000.0000 mL | INTRAVENOUS | Status: DC

## 2012-06-13 MED ORDER — ONDANSETRON HCL 4 MG/2ML IJ SOLN
4.0000 mg | Freq: Once | INTRAMUSCULAR | Status: AC
  Administered 2012-06-13: 4 mg via INTRAVENOUS
  Filled 2012-06-13: qty 2

## 2012-06-13 MED ORDER — SODIUM CHLORIDE 0.9 % IV SOLN
1000.0000 mL | Freq: Once | INTRAVENOUS | Status: AC
  Administered 2012-06-13: 1000 mL via INTRAVENOUS

## 2012-06-13 NOTE — ED Notes (Addendum)
Pt reports central chest pain that started last night, abdominal pain last night with nausea, no vomiting. This morning pt had bowel movement with bright red blood, 30 ml estimated, lower abdominal pain. reports slight SOB. Pain 6/10.

## 2012-06-13 NOTE — ED Provider Notes (Signed)
History     CSN: [id number] Arrival date & time 06/13/12  0917 First MD Initiated Contact with Patient 06/13/12 0932      Chief Complaint  Patient presents with  . Rectal Bleeding  . Chest Pain  . Abdominal Pain    HPI Pt started having pain in her chest and abdomen around 11 pm last night.  The pain in her abdomen was llq and the pain in her chest was right upper chest.  They were sharp pain, constant.  Continued throughout night into today.  No fever, no cough.  No vomiting.  No diarrhea.  This am she noticed blood in her stool.  No blood clot history, no CAD. No prior history of sam.e    Past Medical History  Diagnosis Date  . Bipolar 1 disorder   . PTSD (post-traumatic stress disorder)   . ADHD (attention deficit hyperactivity disorder)   . UTI (lower urinary tract infection)     Past Surgical History  Procedure Laterality Date  . Ectopic pregnancy surgery    . Tubal ligation    . Laparoscopy      Family History  Problem Relation Age of Onset  . Cancer Other   . Hypertension Other   . Diabetes Other   . Drug abuse Paternal Grandfather     History  Substance Use Topics  . Smoking status: Never Smoker   . Smokeless tobacco: Never Used  . Alcohol Use: No    OB History   Grav Para Term Preterm Abortions TAB SAB Ect Mult Living   4 3 3  1   1  3       Review of Systems  Allergies  Penicillins  Home Medications   Current Outpatient Rx  Name  Route  Sig  Dispense  Refill  . amphetamine-dextroamphetamine (ADDERALL) 20 MG tablet   Oral   Take 20 mg by mouth 2 (two) times daily.         . diclofenac (CATAFLAM) 50 MG tablet   Oral   Take 1 tablet (50 mg total) by mouth 3 (three) times daily.   40 tablet   1   . FLUoxetine (PROZAC) 10 MG capsule   Oral   Take 20 mg by mouth daily.          . hydrOXYzine (VISTARIL) 25 MG capsule   Oral   Take 25 mg by mouth 3 (three) times daily as needed for itching.         Marland Kitchen LORazepam (ATIVAN) 1 MG  tablet   Oral   Take 1 mg by mouth every 8 (eight) hours as needed. For anxiety.           BP 113/70  Pulse 81  Temp(Src) 99.7 F (37.6 C) (Oral)  Resp 16  SpO2 98%  LMP 05/30/2012  Physical Exam  Nursing note and vitals reviewed. Constitutional: She appears well-developed and well-nourished. No distress.  HENT:  Head: Normocephalic and atraumatic.  Right Ear: External ear normal.  Left Ear: External ear normal.  Eyes: Conjunctivae are normal. Right eye exhibits no discharge. Left eye exhibits no discharge. No scleral icterus.  Neck: Neck supple. No tracheal deviation present.  Cardiovascular: Normal rate, regular rhythm and intact distal pulses.   Pulmonary/Chest: Effort normal and breath sounds normal. No stridor. No respiratory distress. She has no wheezes. She has no rales.  Abdominal: Soft. Bowel sounds are normal. She exhibits no distension, no ascites and no mass. There is tenderness in the epigastric  area and left lower quadrant. There is no rebound, no guarding, no tenderness at McBurney's point and negative Murphy's sign. No hernia.  Mild,  Genitourinary:  Rectal: No mass, no bright red blood   Musculoskeletal: She exhibits no edema and no tenderness.  Neurological: She is alert. She has normal strength. No sensory deficit. Cranial nerve deficit:  no gross defecits noted. She exhibits normal muscle tone. She displays no seizure activity. Coordination normal.  Skin: Skin is warm and dry. No rash noted.  Psychiatric: She has a normal mood and affect.    ED Course  Procedures (including critical care time)  Rate: 85  Rhythm: normal sinus rhythm  QRS Axis: normal  Intervals: normal  ST/T Wave abnormalities: normal  Conduction Disutrbances:none  Narrative Interpretation: consider left atrial abnormality  Old EKG Reviewed: none available  Labs Reviewed  COMPREHENSIVE METABOLIC PANEL - Abnormal; Notable for the following:    Total Bilirubin 0.2 (*)    All other  components within normal limits  URINALYSIS, ROUTINE W REFLEX MICROSCOPIC - Abnormal; Notable for the following:    APPearance CLOUDY (*)    Hgb urine dipstick SMALL (*)    All other components within normal limits  CBC WITH DIFFERENTIAL - Abnormal; Notable for the following:    Hemoglobin 11.9 (*)    All other components within normal limits  URINE MICROSCOPIC-ADD ON - Abnormal; Notable for the following:    Squamous Epithelial / LPF FEW (*)    Bacteria, UA FEW (*)    All other components within normal limits  OCCULT BLOOD, POC DEVICE - Abnormal; Notable for the following:    Fecal Occult Bld POSITIVE (*)    All other components within normal limits  LIPASE, BLOOD  D-DIMER, QUANTITATIVE  POCT I-STAT TROPONIN I   Dg Abd Acute W/chest  06/13/2012   *RADIOLOGY REPORT*  Clinical Data: Bleeding, nausea, upper chest pain  ACUTE ABDOMEN SERIES (ABDOMEN 2 VIEW & CHEST 1 VIEW)  Comparison: 09/28/2011  Findings: Normal heart size, mediastinal contours, and pulmonary vascularity. Lungs clear. No pleural effusion or pneumothorax. Normal bowel gas pattern. No bowel dilatation, bowel wall thickening, or free intraperitoneal air. Scattered pelvic phleboliths. No acute osseous findings or definite urinary tract calcifications.  IMPRESSION: No acute abnormalities.   Original Report Authenticated By: Ulyses Southward, M.D.     1. Chest pain   2. Rectal bleeding       MDM  Chest pain The patient's symptoms are atypical. Her exam is reassuring today. Her laboratory tests are also normal.  I doubt that her symptoms are related to coronary artery disease. Her symptoms are not suggestive of pulmonary embolism.  Abdominal pain Patient has a benign abdominal exam. I doubt diverticulitis, bowel obstruction, colitis other acute emergency medical condition. She does have guaiac positive stools. However, her hemoglobin is stable. There doesn't appear to be evidence of a significant gastrointestinal bleed. I think  it is reasonable for the patient to follow up with this as an outpatient status. I will give her a referral to gastroenterology        Celene Kras, MD 06/13/12 1204

## 2012-11-22 ENCOUNTER — Other Ambulatory Visit: Payer: Self-pay

## 2013-05-01 ENCOUNTER — Telehealth: Payer: Self-pay

## 2013-05-01 ENCOUNTER — Ambulatory Visit: Payer: Self-pay | Admitting: Obstetrics & Gynecology

## 2013-05-01 NOTE — Telephone Encounter (Signed)
Pt. Missed today's appointment for Annual with Dr. Ihor Dow. Attempted to call pt. Someone picked up but no one answered. Non-urgent. Not rescheduled at this time. Will send letter for pt. To call and re-schedule at her convenience.

## 2013-07-11 ENCOUNTER — Ambulatory Visit: Payer: Self-pay | Admitting: Advanced Practice Midwife

## 2013-08-22 ENCOUNTER — Ambulatory Visit: Payer: Self-pay | Admitting: Obstetrics and Gynecology

## 2013-08-22 ENCOUNTER — Encounter: Payer: Self-pay | Admitting: *Deleted

## 2013-08-22 ENCOUNTER — Telehealth: Payer: Self-pay | Admitting: *Deleted

## 2013-08-22 NOTE — Telephone Encounter (Signed)
Attempted to contact patient, no answer, left message for patient to call clinic and reschedule missed appointment.  Will send letter.  Letter sent.

## 2013-10-08 ENCOUNTER — Emergency Department (HOSPITAL_COMMUNITY)
Admission: EM | Admit: 2013-10-08 | Discharge: 2013-10-08 | Disposition: A | Discharge status: Home / Self Care | Payer: Medicaid Other | Source: Home / Self Care

## 2013-10-08 ENCOUNTER — Encounter (HOSPITAL_COMMUNITY): Payer: Self-pay | Admitting: Emergency Medicine

## 2013-10-08 DIAGNOSIS — M5442 Lumbago with sciatica, left side: Secondary | ICD-10-CM

## 2013-10-08 DIAGNOSIS — M5430 Sciatica, unspecified side: Secondary | ICD-10-CM

## 2013-10-08 DIAGNOSIS — M7918 Myalgia, other site: Secondary | ICD-10-CM

## 2013-10-08 DIAGNOSIS — M5416 Radiculopathy, lumbar region: Secondary | ICD-10-CM

## 2013-10-08 DIAGNOSIS — IMO0002 Reserved for concepts with insufficient information to code with codable children: Secondary | ICD-10-CM

## 2013-10-08 DIAGNOSIS — M545 Low back pain, unspecified: Secondary | ICD-10-CM

## 2013-10-08 MED ORDER — DICLOFENAC SODIUM 1 % TD GEL: 1.0000 "application " | Freq: Four times a day (QID) | TRANSDERMAL | Status: DC

## 2013-10-08 MED ORDER — METHYLPREDNISOLONE 4 MG PO KIT: PACK | ORAL | Status: DC

## 2013-10-08 MED ORDER — TRAMADOL HCL 50 MG PO TABS: 50.0000 mg | ORAL_TABLET | Freq: Four times a day (QID) | ORAL | Status: DC | PRN

## 2013-10-08 NOTE — ED Provider Notes (Signed)
CSN: [id number]     Arrival date & time 10/08/13  1730 History   First MD Initiated Contact with Patient 10/08/13 1816     Chief Complaint  Patient presents with  . Hip Pain  . Leg Pain   (Consider location/radiation/quality/duration/timing/severity/associated sxs/prior Treatment) HPI Comments: 33 year old female complaining of sharp pain in the left low back radiating along the posterior hip and thigh. It started approximately 5 days ago. There is no known history of trauma or injury. 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 The pain is worse with sitting and lying in bed or with movement. Denies focal paresthesias or weakness. The pain in the thigh and leg feels like pins and needles.   Past Medical History  Diagnosis Date  . Bipolar 1 disorder   . PTSD (post-traumatic stress disorder)   . ADHD (attention deficit hyperactivity disorder)   . UTI (lower urinary tract infection)    Past Surgical History  Procedure Laterality Date  . Ectopic pregnancy surgery    . Tubal ligation    . Laparoscopy     Family History  Problem Relation Age of Onset  . Cancer Other   . Hypertension Other   . Diabetes Other   . Drug abuse Paternal Grandfather    History  Substance Use Topics  . Smoking status: Never Smoker   . Smokeless tobacco: Never Used  . Alcohol Use: No   OB History   Grav Para Term Preterm Abortions TAB SAB Ect Mult Living   4 3 3  1   1  3      Review of Systems  Constitutional: Negative for fever, chills and activity change.  HENT: Negative.   Respiratory: Negative.   Cardiovascular: Negative.   Musculoskeletal: Positive for back pain.       As per HPI  Skin: Negative for color change, pallor and rash.  Neurological: Negative.     Allergies  Penicillins  Home Medications   Prior to Admission medications   Medication Sig Start Date End Date Taking? Authorizing Provider  amphetamine-dextroamphetamine (ADDERALL) 20 MG  tablet Take 20 mg by mouth 2 (two) times daily.   Yes Historical Provider, MD  FLUoxetine (PROZAC) 10 MG capsule Take 20 mg by mouth daily.    Yes Historical Provider, MD  diclofenac (CATAFLAM) 50 MG tablet Take 1 tablet (50 mg total) by mouth 3 (three) times daily. 05/16/12   Lavonia Drafts, MD  diclofenac sodium (VOLTAREN) 1 % GEL Apply 1 application topically 4 (four) times daily. 10/08/13   Janne Napoleon, NP  hydrOXYzine (VISTARIL) 25 MG capsule Take 25 mg by mouth 3 (three) times daily as needed for itching.    Historical Provider, MD  LORazepam (ATIVAN) 1 MG tablet Take 1 mg by mouth every 8 (eight) hours as needed. For anxiety.    Historical Provider, MD  methylPREDNISolone (MEDROL DOSEPAK) 4 MG tablet Take as directed 10/08/13   Janne Napoleon, NP  traMADol (ULTRAM) 50 MG tablet Take 1 tablet (50 mg total) by mouth every 6 (six) hours as needed. 10/08/13   Janne Napoleon, NP   BP 108/73  Pulse 68  Temp(Src) 98.3 F (36.8 C) (Oral)  Resp 18  SpO2 100%  LMP 09/30/2013 Physical Exam  Nursing note and vitals reviewed. Constitutional: She is oriented to person, place, and time. She appears well-developed and well-nourished. No distress.  HENT:  Head: Normocephalic and atraumatic.  Eyes: EOM are normal. Pupils are equal, round, and  reactive to light.  Neck: Normal range of motion. Neck supple.  Pulmonary/Chest: Effort normal. No respiratory distress.  Musculoskeletal:  No spinal tenderness Tenderness to the L para lumbosacral muscles, posterior L thigh muscles.  Neg SLR. Nl strength and ROM  Neurological: She is alert and oriented to person, place, and time. No cranial nerve deficit.  Skin: Skin is warm and dry.    ED Course  Procedures (including critical care time) Labs Review Labs Reviewed - No data to display  Imaging Review No results found.   MDM   1. Left-sided low back pain with left-sided sciatica   2. Pain, radicular, lumbar   3. Lumbar muscle pain     Medrol Dilclofenac gel Tramadol Heat Stretches as discussed Limit prolonged standing.      Janne Napoleon, NP 10/08/13 [phone]

## 2013-10-08 NOTE — ED Notes (Signed)
C/o left hip pain that radiates down to left leg onset 6 days Denies inj/trauma Pain is constant and increases when sitting for long periods or laying down Steady gait; NAD Alert, no signs of acute distress.

## 2013-10-08 NOTE — ED Provider Notes (Signed)
Medical screening examination/treatment/procedure(s) were performed by a resident physician or non-physician practitioner and as the supervising physician I was immediately available for consultation/collaboration.  Linna Darner, MD Family Medicine   Waldemar Dickens, MD 10/08/13 [phone]

## 2013-10-08 NOTE — Discharge Instructions (Signed)
Back Pain, Adult °Low back pain is very common. About 1 in 5 people have back pain. The cause of low back pain is rarely dangerous. The pain often gets better over time. About half of people with a sudden onset of back pain feel better in just 2 weeks. About 8 in 10 people feel better by 6 weeks.  °CAUSES °Some common causes of back pain include: °· Strain of the muscles or ligaments supporting the spine. °· Wear and tear (degeneration) of the spinal discs. °· Arthritis. °· Direct injury to the back. °DIAGNOSIS °Most of the time, the direct cause of low back pain is not known. However, back pain can be treated effectively even when the exact cause of the pain is unknown. Answering your caregiver's questions about your overall health and symptoms is one of the most accurate ways to make sure the cause of your pain is not dangerous. If your caregiver needs more information, he or she may order lab work or imaging tests (X-rays or MRIs). However, even if imaging tests show changes in your back, this usually does not require surgery. °HOME CARE INSTRUCTIONS °For many people, back pain returns. Since low back pain is rarely dangerous, it is often a condition that people can learn to manage on their own.  °· Remain active. It is stressful on the back to sit or stand in one place. Do not sit, drive, or stand in one place for more than 30 minutes at a time. Take short walks on level surfaces as soon as pain allows. Try to increase the length of time you walk each day. °· Do not stay in bed. Resting more than 1 or 2 days can delay your recovery. °· Do not avoid exercise or work. Your body is made to move. It is not dangerous to be active, even though your back may hurt. Your back will likely heal faster if you return to being active before your pain is gone. °· Pay attention to your body when you  bend and lift. Many people have less discomfort when lifting if they bend their knees, keep the load close to their bodies, and  avoid twisting. Often, the most comfortable positions are those that put less stress on your recovering back. °· Find a comfortable position to sleep. Use a firm mattress and lie on your side with your knees slightly bent. If you lie on your back, put a pillow under your knees. °· Only take over-the-counter or prescription medicines as directed by your caregiver. Over-the-counter medicines to reduce pain and inflammation are often the most helpful. Your caregiver may prescribe muscle relaxant drugs. These medicines help dull your pain so you can more quickly return to your normal activities and healthy exercise. °· Put ice on the injured area. °¨ Put ice in a plastic bag. °¨ Place a towel between your skin and the bag. °¨ Leave the ice on for 15-20 minutes, 03-04 times a day for the first 2 to 3 days. After that, ice and heat may be alternated to reduce pain and spasms. °· Ask your caregiver about trying back exercises and gentle massage. This may be of some benefit. °· Avoid feeling anxious or stressed. Stress increases muscle tension and can worsen back pain. It is important to recognize when you are anxious or stressed and learn ways to manage it. Exercise is a great option. °SEEK MEDICAL CARE IF: °· You have pain that is not relieved with rest or medicine. °· You have pain that does not improve in 1 week. °· You have new symptoms. °· You are generally not feeling well. °SEEK   IMMEDIATE MEDICAL CARE IF:   You have pain that radiates from your back into your legs.  You develop new bowel or bladder control problems.  You have unusual weakness or numbness in your arms or legs.  You develop nausea or vomiting.  You develop abdominal pain.  You feel faint. Document Released: 01/03/2005 Document Revised: 07/05/2011 Document Reviewed: 05/07/2013 [facility] Patient Information 2015 [place], [place]. This information is not intended to replace advice given to you by your health care provider. Make sure you  discuss any questions you have with your health care provider.  Lumbosacral Radiculopathy Lumbosacral radiculopathy is a pinched nerve or nerves in the low back (lumbosacral area). When this happens you may have weakness in your legs and may not be able to stand on your toes. You may have pain going down into your legs. There may be difficulties with walking normally. There are many causes of this problem. Sometimes this may happen from an injury, or simply from arthritis or boney problems. It may also be caused by other illnesses such as diabetes. If there is no improvement after treatment, further studies may be done to find the exact cause. DIAGNOSIS  X-rays may be needed if the problems become long standing. Electromyograms may be done. This study is one in which the working of nerves and muscles is studied. HOME CARE INSTRUCTIONS   Applications of ice packs may be helpful. Ice can be used in a plastic bag with a towel around it to prevent frostbite to skin. This may be used every 2 hours for 20 to 30 minutes, or as needed, while awake, or as directed by your caregiver.  Only take over-the-counter or prescription medicines for pain, discomfort, or fever as directed by your caregiver.  If physical therapy was prescribed, follow your caregiver's directions. SEEK IMMEDIATE MEDICAL CARE IF:   You have pain not controlled with medications.  You seem to be getting worse rather than better.  You develop increasing weakness in your legs.  You develop loss of bowel or bladder control.  You have difficulty with walking or balance, or develop clumsiness in the use of your legs.  You have a fever. MAKE SURE YOU:   Understand these instructions.  Will watch your condition.  Will get help right away if you are not doing well or get worse. Document Released: 01/03/2005 Document Revised: 03/28/2011 Document Reviewed: 08/24/2007 [facility] Patient Information 2015 [place], [place]. This information  is not intended to replace advice given to you by your health care provider. Make sure you discuss any questions you have with your health care provider.

## 2013-11-18 ENCOUNTER — Encounter (HOSPITAL_COMMUNITY): Payer: Self-pay | Admitting: Emergency Medicine

## 2014-03-04 ENCOUNTER — Emergency Department (HOSPITAL_COMMUNITY)
Admission: EM | Admit: 2014-03-04 | Discharge: 2014-03-04 | Disposition: A | Discharge status: Home / Self Care | Payer: Medicaid Other | Source: Home / Self Care | Attending: Family Medicine | Admitting: Family Medicine

## 2014-03-04 ENCOUNTER — Encounter (HOSPITAL_COMMUNITY): Payer: Self-pay | Admitting: Family Medicine

## 2014-03-04 DIAGNOSIS — N390 Urinary tract infection, site not specified: Secondary | ICD-10-CM

## 2014-03-04 LAB — POCT URINALYSIS DIP (DEVICE)
Glucose, UA: NEGATIVE mg/dL
KETONES UR: NEGATIVE mg/dL
Leukocytes, UA: NEGATIVE
Nitrite: NEGATIVE
PH: 6 (ref 5.0–8.0)
Protein, ur: 30 mg/dL — AB
Specific Gravity, Urine: >=1.03 (ref 1.005–1.030)
Urobilinogen, UA: 0.2 mg/dL (ref 0.0–1.0)

## 2014-03-04 LAB — URINALYSIS, ROUTINE W REFLEX MICROSCOPIC
Glucose, UA: NEGATIVE mg/dL
KETONES UR: 15 mg/dL — AB
LEUKOCYTES UA: NEGATIVE
Nitrite: NEGATIVE
PROTEIN: NEGATIVE mg/dL
Specific Gravity, Urine: 1.035 — ABNORMAL HIGH (ref 1.005–1.030)
Urobilinogen, UA: 0.2 mg/dL (ref 0.0–1.0)
pH: 5.5 (ref 5.0–8.0)

## 2014-03-04 LAB — URINE MICROSCOPIC-ADD ON

## 2014-03-04 MED ORDER — FLUCONAZOLE 150 MG PO TABS: 150.0000 mg | ORAL_TABLET | Freq: Every day | ORAL | Status: DC

## 2014-03-04 MED ORDER — PHENAZOPYRIDINE HCL 100 MG PO TABS: 100.0000 mg | ORAL_TABLET | Freq: Three times a day (TID) | ORAL | Status: DC | PRN

## 2014-03-04 MED ORDER — CIPROFLOXACIN HCL 500 MG PO TABS: 500.0000 mg | ORAL_TABLET | Freq: Two times a day (BID) | ORAL | Status: DC

## 2014-03-04 NOTE — ED Provider Notes (Signed)
CSN: [id number]     Arrival date & time 03/04/14  1829 History   First MD Initiated Contact with Patient 03/04/14 [phone]     Chief Complaint  Patient presents with  . Urinary Tract Infection   (Consider location/radiation/quality/duration/timing/severity/associated sxs/prior Treatment) HPI  UTI: started 2 wks ago. Frequency and foul odor to urine and dysuria. Associated w/ lower abd discomfort. "feels like bladder is never empty." Denies fevers, cp, sob, rash, hematuria. H/o chronic UTIs and started on daily macrobid 1 year ago by Urology.    Past Medical History  Diagnosis Date  . Bipolar 1 disorder   . PTSD (post-traumatic stress disorder)   . ADHD (attention deficit hyperactivity disorder)   . UTI (lower urinary tract infection)    Past Surgical History  Procedure Laterality Date  . Ectopic pregnancy surgery    . Tubal ligation    . Laparoscopy     Family History  Problem Relation Age of Onset  . Cancer Other   . Hypertension Other   . Diabetes Other   . Drug abuse Paternal Grandfather    History  Substance Use Topics  . Smoking status: Never Smoker   . Smokeless tobacco: Never Used  . Alcohol Use: No   OB History    Gravida Para Term Preterm AB TAB SAB Ectopic Multiple Living   4 3 3  1   1  3      Review of Systems Per HPI with all other pertinent systems negative.   Allergies  Penicillins  Home Medications   Prior to Admission medications   Medication Sig Start Date End Date Taking? Authorizing Provider  NITROFURANTOIN PO Take by mouth.   Yes Historical Provider, MD  amphetamine-dextroamphetamine (ADDERALL) 20 MG tablet Take 20 mg by mouth 2 (two) times daily.    Historical Provider, MD  ciprofloxacin (CIPRO) 500 MG tablet Take 1 tablet (500 mg total) by mouth 2 (two) times daily. 03/04/14   Waldemar Dickens, MD  diclofenac (CATAFLAM) 50 MG tablet Take 1 tablet (50 mg total) by mouth 3 (three) times daily. 05/16/12   Lavonia Drafts, MD  diclofenac  sodium (VOLTAREN) 1 % GEL Apply 1 application topically 4 (four) times daily. 10/08/13   Janne Napoleon, NP  fluconazole (DIFLUCAN) 150 MG tablet Take 1 tablet (150 mg total) by mouth daily. Repeat dose in 3 days 03/04/14   Waldemar Dickens, MD  FLUoxetine (PROZAC) 10 MG capsule Take 20 mg by mouth daily.     Historical Provider, MD  hydrOXYzine (VISTARIL) 25 MG capsule Take 25 mg by mouth 3 (three) times daily as needed for itching.    Historical Provider, MD  LORazepam (ATIVAN) 1 MG tablet Take 1 mg by mouth every 8 (eight) hours as needed. For anxiety.    Historical Provider, MD  phenazopyridine (PYRIDIUM) 100 MG tablet Take 1-2 tablets (100-200 mg total) by mouth 3 (three) times daily as needed for pain. 03/04/14   Waldemar Dickens, MD  traMADol (ULTRAM) 50 MG tablet Take 1 tablet (50 mg total) by mouth every 6 (six) hours as needed. 10/08/13   Janne Napoleon, NP   BP 98/70 mmHg  Pulse 58  Temp(Src) 98.1 F (36.7 C) (Oral)  Resp 12  SpO2 100%  LMP 02/17/2014 Physical Exam  Constitutional: She appears well-developed and well-nourished.  HENT:  Head: Normocephalic and atraumatic.  Eyes: Pupils are equal, round, and reactive to light.  Neck: Normal range of motion.  Cardiovascular: Normal rate, normal heart sounds and  intact distal pulses.   Pulmonary/Chest: Effort normal and breath sounds normal.  Abdominal: Soft. Bowel sounds are normal.  Suprapubic tenderness on palpation  Musculoskeletal: Normal range of motion.  Neurological: She is alert.  Skin: Skin is warm. No rash noted.  Psychiatric: Her behavior is normal. Judgment and thought content normal.    ED Course  Procedures (including critical care time) Labs Review Labs Reviewed  POCT URINALYSIS DIP (DEVICE) - Abnormal; Notable for the following:    Bilirubin Urine SMALL (*)    Hgb urine dipstick MODERATE (*)    Protein, ur 30 (*)    All other components within normal limits  URINE CULTURE  URINALYSIS, ROUTINE W REFLEX MICROSCOPIC     Imaging Review No results found.   MDM   1. UTI (lower urinary tract infection)    Chronic UTIs. Currently on Macrobid. Patient historically treated at urology for symptoms. Urged patient in the future to call their office for follow-up as this is clearly becoming Darlis Loan did issue for the patient. At this time will treat with Cipro, 7 day course. UA without overt sign of infection but very foul in odor. We'll send for microscopic examination and culture. patient start probiotic and daily yogurt. Diflucan when necessary yeast infection Precautions given and all questions answered  Linna Darner, MD Family Medicine 03/04/2014, 10:24 AM      Waldemar Dickens, MD 03/04/14 1025

## 2014-03-04 NOTE — ED Notes (Signed)
Pt   Reports     Symptoms   Of  Frequency  Of  Urination      -       Low  abd  Pain        With   Symptoms   X  2  Weeks           Pt    Has  A  History     Of  uti  And  Takes  Macrodantin      On  A   maintance        Schedule

## 2014-03-04 NOTE — Discharge Instructions (Signed)
You likely have another UTI Please stop the macrobid and start the cipro Please use the pyridium for pain relief Please use diflucan if you get a yeast infection Please take a daily probiotic or yogurt

## 2014-03-05 LAB — URINE CULTURE

## 2014-03-23 IMAGING — CT CT ABD-PELV W/ CM
2 of 4 series · 17 of 46 positions shown, 19 images · IV contrast (omnipaque)
Comparison: 11/25/2009

CLINICAL DATA: Right lower quadrant abdominal pain.

CT ABDOMEN AND PELVIS WITH CONTRAST
TECHNIQUE: Multidetector CT imaging of the abdomen and pelvis was
performed following the standard protocol during bolus
administration of intravenous contrast.
Contrast: 100mL OMNIPAQUE IOHEXOL 300 MG/ML  SOLN

[Series 2: abd/pelv with 5.0 b31f st · axial · 0.88mm/px · z∈[-530,-100]mm · 14 of 94 slices shown, 16 images]
[im 4/94  soft-tissue]
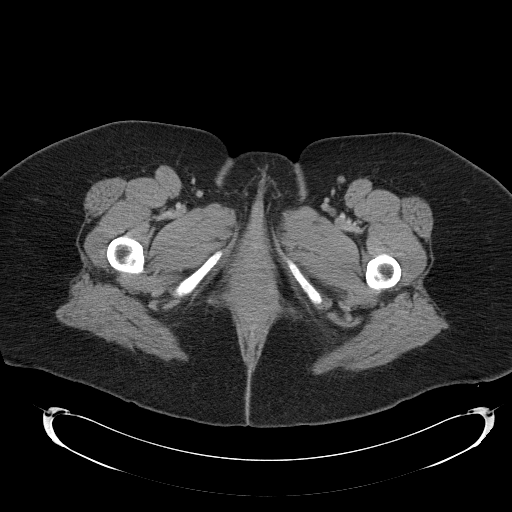
[im 4/94  bone]
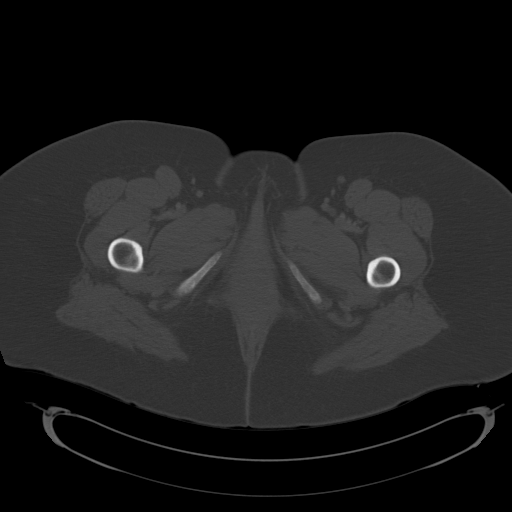
[im 12/94  soft-tissue]
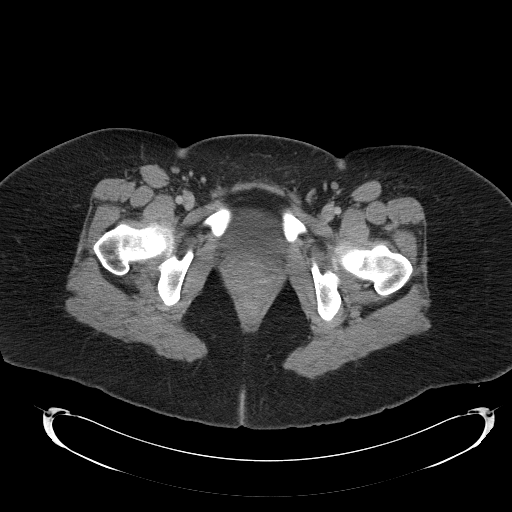
[im 19/94  soft-tissue]
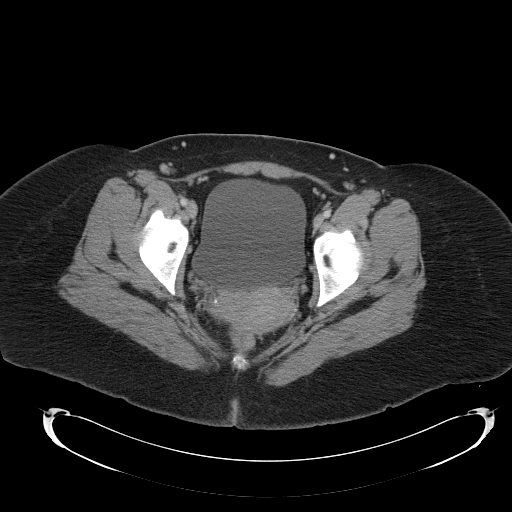
[im 27/94  soft-tissue]
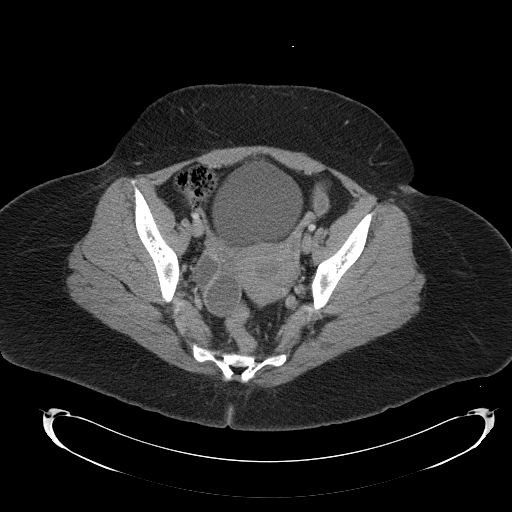
[im 30/94  soft-tissue]
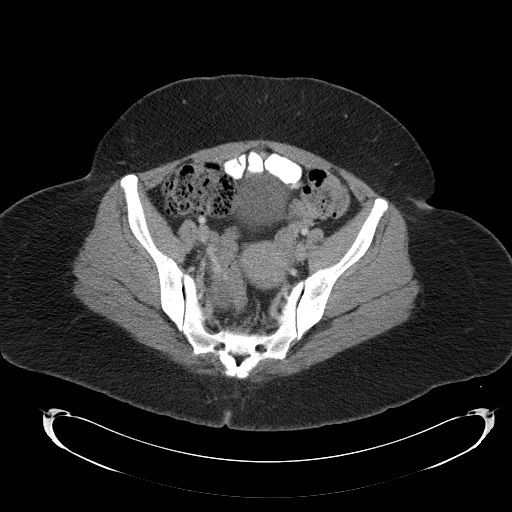
[im 38/94  soft-tissue]
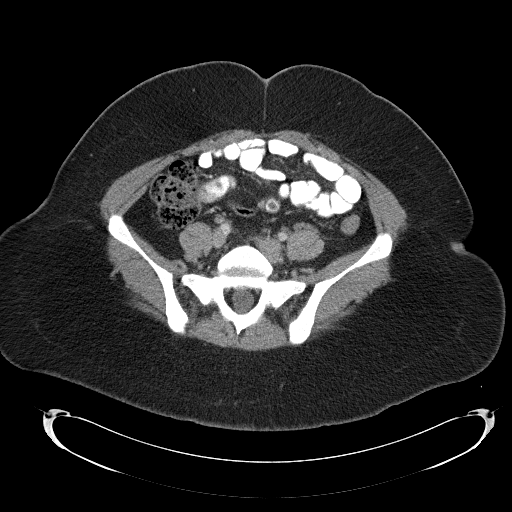
[im 45/94  soft-tissue]
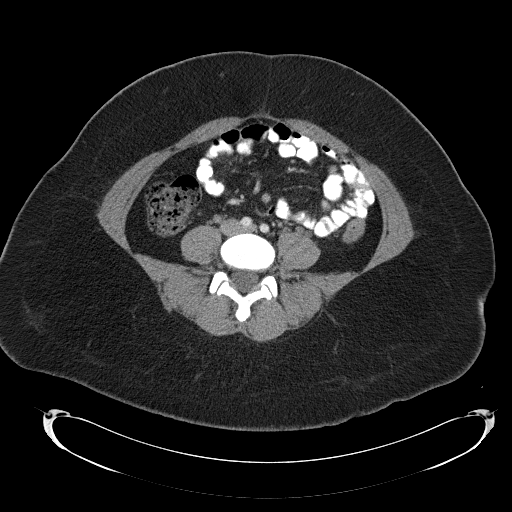
[im 49/94  soft-tissue]
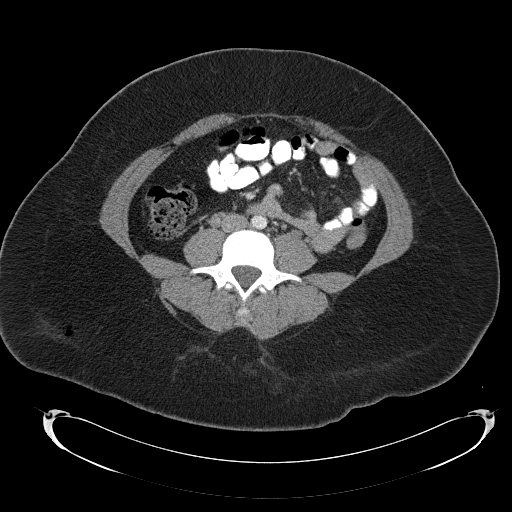
[im 56/94  soft-tissue]
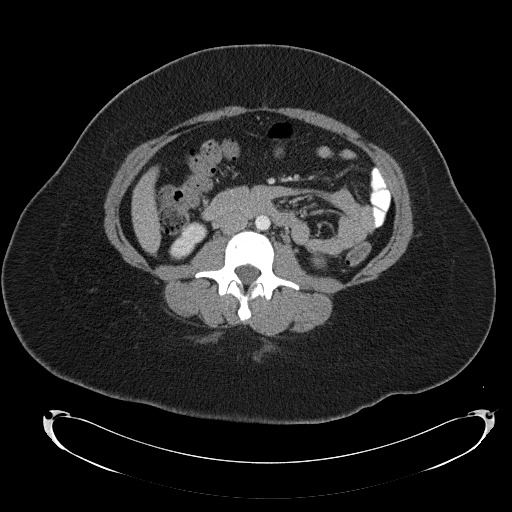
[im 56/94  bone]
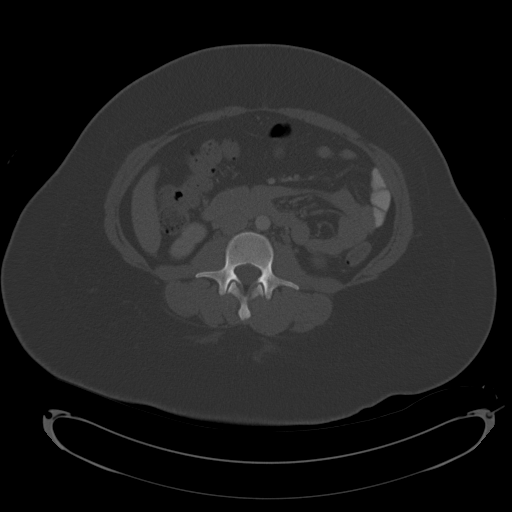
[im 64/94  soft-tissue]
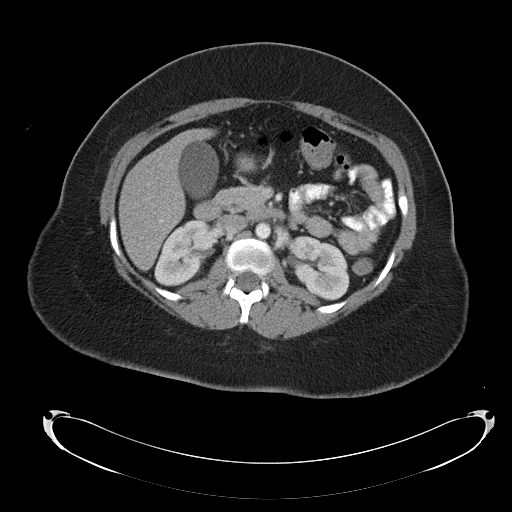
[im 71/94  soft-tissue]
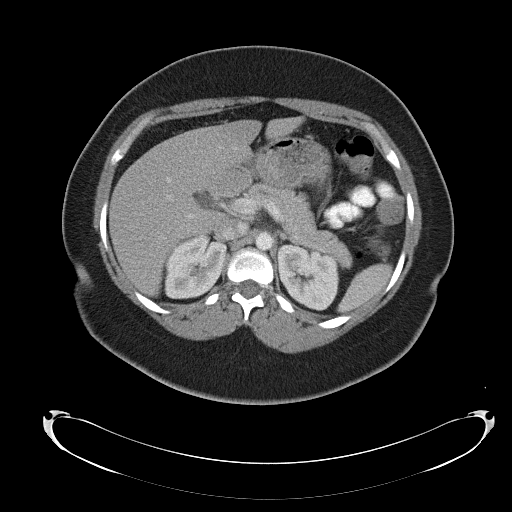
[im 75/94  soft-tissue]
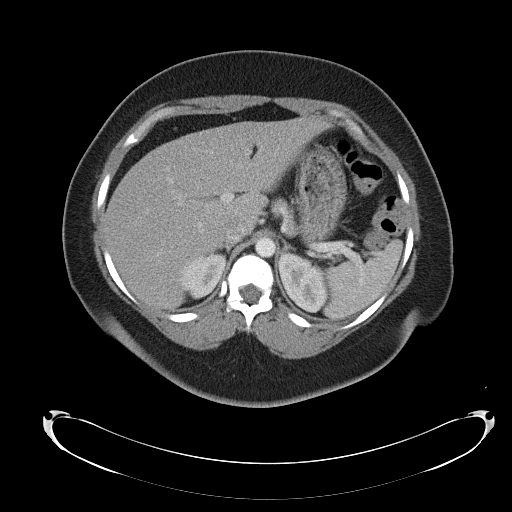
[im 82/94  soft-tissue]
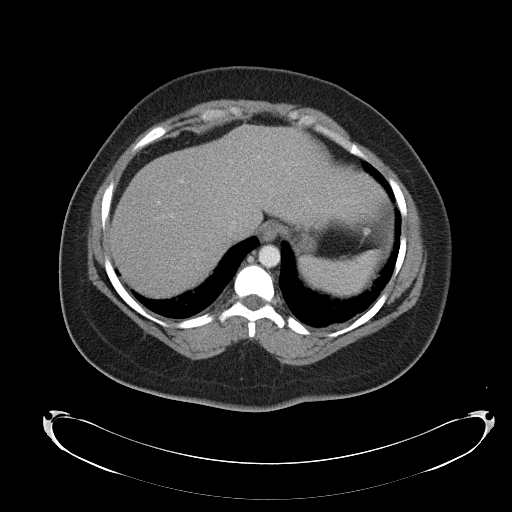
[im 90/94  soft-tissue]
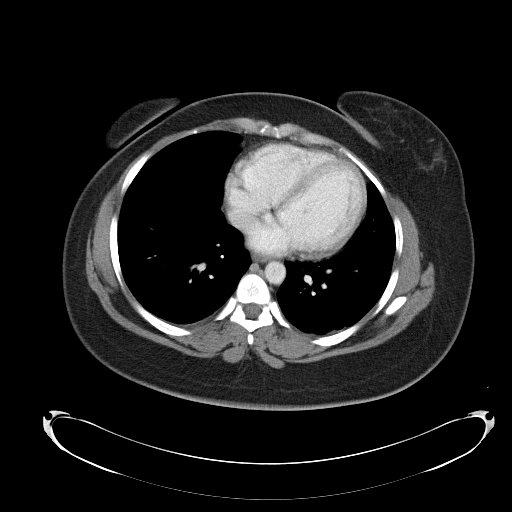

[Series 5: coronals · coronal · 0.91mm/px · 3 of 171 slices shown]
[im 57/171  soft-tissue]
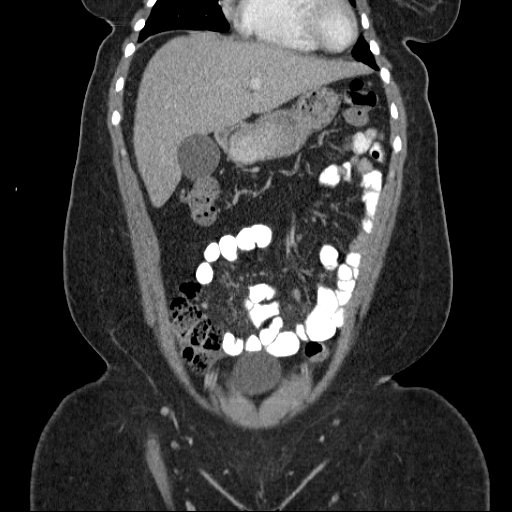
[im 76/171  soft-tissue]
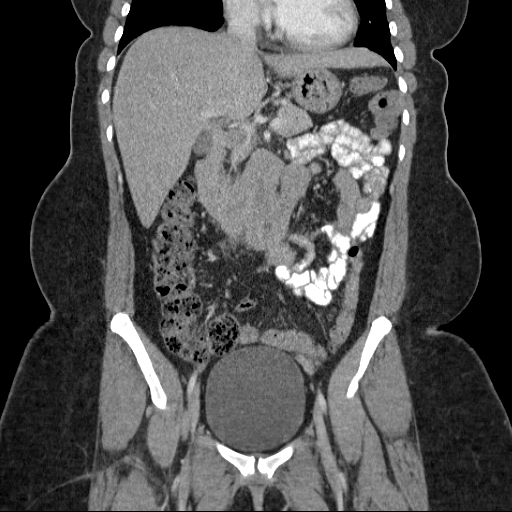
[im 95/171  soft-tissue]
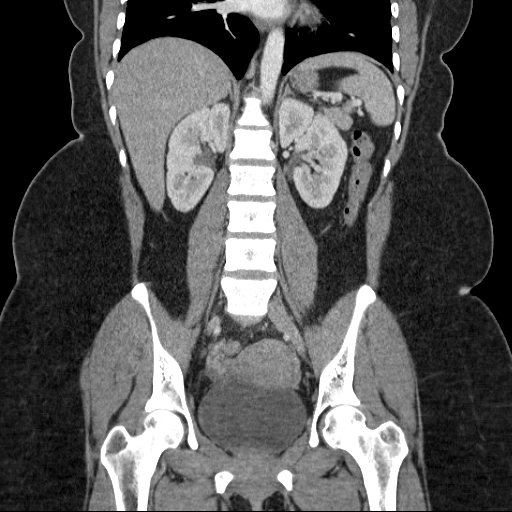

[17 of 46 positions shown; findings below may reference images not displayed]

FINDINGS: Trace pleural effusions.  Normal heart size.  The

Low attenuation of the liver suggests fatty infiltration.
Distended gallbladder.  No radiodense gallstones.  No biliary
ductal dilatation.  Unremarkable spleen, pancreas, adrenal glands.

Symmetric renal enhancement.  No hydronephrosis or hydroureter.

No bowel obstruction.  No CT evidence for colitis.  Normal
appendix.  No free intraperitoneal air.  No lymphadenopathy.

Normal caliber vasculature.

Thin-walled bladder.  Multicystic appearance to the right adnexa
otherwise unremarkable CT appearance to the uterus and left adnexa.
Trace free fluid within the pelvis.

No acute osseous finding.
IMPRESSION: Normal appendix.

Hepatic steatosis.

Distended gallbladder.  No radiodense gallstones.

Multicystic appearance to the right adnexa may reflect physiologic
ovarian cysts.  If there is clinical concern for acute pelvic
pathology, consider pelvic ultrasound.

## 2014-06-13 ENCOUNTER — Ambulatory Visit (INDEPENDENT_AMBULATORY_CARE_PROVIDER_SITE_OTHER): Payer: Medicaid Other | Admitting: Certified Nurse Midwife

## 2014-06-13 ENCOUNTER — Encounter: Payer: Self-pay | Admitting: Certified Nurse Midwife

## 2014-06-13 VITALS — BP 115/79 | HR 70 | Temp 98.3°F | Ht 65.0 in | Wt 196.0 lb

## 2014-06-13 DIAGNOSIS — K5901 Slow transit constipation: Secondary | ICD-10-CM | POA: Diagnosis not present

## 2014-06-13 DIAGNOSIS — Z0000 Encounter for general adult medical examination without abnormal findings: Secondary | ICD-10-CM | POA: Diagnosis not present

## 2014-06-13 DIAGNOSIS — E669 Obesity, unspecified: Secondary | ICD-10-CM | POA: Diagnosis not present

## 2014-06-13 DIAGNOSIS — Z01419 Encounter for gynecological examination (general) (routine) without abnormal findings: Secondary | ICD-10-CM

## 2014-06-13 LAB — CBC
HEMATOCRIT: 36.2 % (ref 36.0–46.0)
Hemoglobin: 11.7 g/dL — ABNORMAL LOW (ref 12.0–15.0)
MCH: 26.1 pg (ref 26.0–34.0)
MCHC: 32.3 g/dL (ref 30.0–36.0)
MCV: 80.6 fL (ref 78.0–100.0)
MPV: 10.1 fL (ref 8.6–12.4)
PLATELETS: 291 10*3/uL (ref 150–400)
RBC: 4.49 MIL/uL (ref 3.87–5.11)
RDW: 15.2 % (ref 11.5–15.5)
WBC: 6.6 10*3/uL (ref 4.0–10.5)

## 2014-06-13 LAB — COMPREHENSIVE METABOLIC PANEL
ALBUMIN: 4.1 g/dL (ref 3.5–5.2)
ALK PHOS: 59 U/L (ref 39–117)
ALT: <8 U/L (ref 0–35)
AST: 13 U/L (ref 0–37)
BUN: 9 mg/dL (ref 6–23)
CALCIUM: 9.4 mg/dL (ref 8.4–10.5)
CO2: 23 meq/L (ref 19–32)
CREATININE: 0.62 mg/dL (ref 0.50–1.10)
Chloride: 103 mEq/L (ref 96–112)
Glucose, Bld: 85 mg/dL (ref 70–99)
Potassium: 3.8 mEq/L (ref 3.5–5.3)
Sodium: 137 mEq/L (ref 135–145)
TOTAL PROTEIN: 6.9 g/dL (ref 6.0–8.3)
Total Bilirubin: 0.4 mg/dL (ref 0.2–1.2)

## 2014-06-13 LAB — TSH: TSH: 1.257 u[IU]/mL (ref 0.350–4.500)

## 2014-06-13 LAB — HEMOGLOBIN A1C
Hgb A1c MFr Bld: 5.7 % — ABNORMAL HIGH (ref <5.7)
MEAN PLASMA GLUCOSE: 117 mg/dL — AB (ref <117)

## 2014-06-13 MED ORDER — SENNOSIDES-DOCUSATE SODIUM 8.6-50 MG PO TABS: 1.0000 | ORAL_TABLET | Freq: Two times a day (BID) | ORAL | Status: DC
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
## 2014-06-13 NOTE — Patient Instructions (Signed)
Levonorgestrel intrauterine device (IUD) What is this medicine? LEVONORGESTREL IUD (LEE voe nor jes trel) is a contraceptive (birth control) device. The device is placed inside the uterus by a healthcare professional. It is used to prevent pregnancy and can also be used to treat heavy bleeding that occurs during your period. Depending on the device, it can be used for 3 to 5 years. This medicine may be used for other purposes; ask your health care provider or pharmacist if you have questions. COMMON BRAND NAME(S): LILETTA, Mirena, Skyla What should I tell my health care provider before I take this medicine? They need to know if you have any of these conditions: -abnormal Pap smear -cancer of the breast, uterus, or cervix -diabetes -endometritis -genital or pelvic infection now or in the past -have more than one sexual partner or your partner has more than one partner -heart disease -history of an ectopic or tubal pregnancy -immune system problems -IUD in place -liver disease or tumor -problems with blood clots or take blood-thinners -use intravenous drugs -uterus of unusual shape -vaginal bleeding that has not been explained -an unusual or allergic reaction to levonorgestrel, other hormones, silicone, or polyethylene, medicines, foods, dyes, or preservatives -pregnant or trying to get pregnant -breast-feeding How should I use this medicine? This device is placed inside the uterus by a health care professional. Talk to your pediatrician regarding the use of this medicine in children. Special care may be needed. Overdosage: If you think you have taken too much of this medicine contact a poison control center or emergency room at once. NOTE: This medicine is only for you. Do not share this medicine with others. What if I miss a dose? This does not apply. What may interact with this medicine? Do not take this medicine with any of the following  medications: -amprenavir -bosentan -fosamprenavir This medicine may also interact with the following medications: -aprepitant -barbiturate medicines for inducing sleep or treating seizures -bexarotene -griseofulvin -medicines to treat seizures like carbamazepine, ethotoin, felbamate, oxcarbazepine, phenytoin, topiramate -modafinil -pioglitazone -rifabutin -rifampin -rifapentine -some medicines to treat HIV infection like atazanavir, indinavir, lopinavir, nelfinavir, tipranavir, ritonavir -St. John's wort -warfarin This list may not describe all possible interactions. Give your health care provider a list of all the medicines, herbs, non-prescription drugs, or dietary supplements you use. Also tell them if you smoke, drink alcohol, or use illegal drugs. Some items may interact with your medicine. What should I watch for while using this medicine? Visit your doctor or health care professional for regular check ups. See your doctor if you or your partner has sexual contact with others, becomes HIV positive, or gets a sexual transmitted disease. This product does not protect you against HIV infection (AIDS) or other sexually transmitted diseases. You can check the placement of the IUD yourself by reaching up to the top of your vagina with clean fingers to feel the threads. Do not pull on the threads. It is a good habit to check placement after each menstrual period. Call your doctor right away if you feel more of the IUD than just the threads or if you cannot feel the threads at all. The IUD may come out by itself. You may become pregnant if the device comes out. If you notice that the IUD has come out use a backup birth control method like condoms and call your health care provider. Using tampons will not change the position of the IUD and are okay to use during your period. What side effects may   I notice from receiving this medicine? Side effects that you should report to your doctor or  health care professional as soon as possible: -allergic reactions like skin rash, itching or hives, swelling of the face, lips, or tongue -fever, flu-like symptoms -genital sores -high blood pressure -no menstrual period for 6 weeks during use -pain, swelling, warmth in the leg -pelvic pain or tenderness -severe or sudden headache -signs of pregnancy -stomach cramping -sudden shortness of breath -trouble with balance, talking, or walking -unusual vaginal bleeding, discharge -yellowing of the eyes or skin Side effects that usually do not require medical attention (report to your doctor or health care professional if they continue or are bothersome): -acne -breast pain -change in sex drive or performance -changes in weight -cramping, dizziness, or faintness while the device is being inserted -headache -irregular menstrual bleeding within first 3 to 6 months of use -nausea This list may not describe all possible side effects. Call your doctor for medical advice about side effects. You may report side effects to FDA at 1-800-FDA-1088. Where should I keep my medicine? This does not apply. NOTE: This sheet is a summary. It may not cover all possible information. If you have questions about this medicine, talk to your doctor, pharmacist, or health care provider.  2015, [employer]. (2011-02-03 13:54:04)  

## 2014-06-14 LAB — HEPATITIS B SURFACE ANTIGEN: Hepatitis B Surface Ag: NEGATIVE

## 2014-06-14 LAB — HIV ANTIBODY (ROUTINE TESTING W REFLEX): HIV: NONREACTIVE

## 2014-06-14 LAB — HEPATITIS C ANTIBODY: HCV Ab: NEGATIVE

## 2014-06-14 LAB — RPR

## 2014-06-17 ENCOUNTER — Other Ambulatory Visit: Payer: Self-pay | Admitting: Certified Nurse Midwife

## 2014-06-17 DIAGNOSIS — N760 Acute vaginitis: Principal | ICD-10-CM

## 2014-06-17 DIAGNOSIS — B9689 Other specified bacterial agents as the cause of diseases classified elsewhere: Secondary | ICD-10-CM

## 2014-06-17 LAB — SURESWAB, VAGINOSIS/VAGINITIS PLUS
ATOPOBIUM VAGINAE: DETECTED Log (cells/mL)
C. PARAPSILOSIS, DNA: NOT DETECTED
C. TROPICALIS, DNA: NOT DETECTED
C. albicans, DNA: NOT DETECTED
C. glabrata, DNA: NOT DETECTED
C. trachomatis RNA, TMA: NOT DETECTED
Gardnerella vaginalis: 7.9 Log (cells/mL)
LACTOBACILLUS SPECIES: NOT DETECTED Log (cells/mL)
MEGASPHAERA SPECIES: 7.5 Log (cells/mL)
N. GONORRHOEAE RNA, TMA: NOT DETECTED
T. VAGINALIS RNA, QL TMA: NOT DETECTED

## 2014-06-17 LAB — PAP IG AND HPV HIGH-RISK: HPV DNA High Risk: NOT DETECTED

## 2014-06-17 MED ORDER — TINIDAZOLE 500 MG PO TABS: 2.0000 g | ORAL_TABLET | Freq: Every day | ORAL | Status: AC

## 2014-06-24 ENCOUNTER — Telehealth: Payer: Self-pay | Admitting: *Deleted

## 2014-06-24 NOTE — Telephone Encounter (Signed)
Patient states she is still interested in the [place] IUD. Patient is not ready to schedule the appointment yet. Patient advised to call when she is ready. Patient advised it is best to call on the first day of her menstrual cycle. Patient verbalized understanding.

## 2014-07-29 ENCOUNTER — Encounter: Payer: Self-pay | Admitting: *Deleted

## 2014-07-29 ENCOUNTER — Encounter: Payer: Medicaid Other | Attending: Certified Nurse Midwife | Admitting: *Deleted

## 2014-07-29 DIAGNOSIS — E669 Obesity, unspecified: Secondary | ICD-10-CM | POA: Diagnosis present

## 2014-07-29 DIAGNOSIS — Z713 Dietary counseling and surveillance: Secondary | ICD-10-CM | POA: Insufficient documentation

## 2014-07-29 NOTE — Progress Notes (Signed)
  Medical Nutrition Therapy:  Appt start time: 1100 end time:  1200.   Assessment:  Primary concerns today: Joy Pittman is here for nutrition counseling pertaining to obesity and new diagnosis of prediabetes.  Her A1c is 5.7%  She has a strong family history of diabetes.  She does the grocery shopping and the cooking for her household.  She states she mostly bakes foods. She eats out every other day at work.  When at home she eats in the bedroom while watching tv.  She thinks she is a slow eater.  She takes Aderral andthat curbs her appetite. She states she has to force herself to eat most of the time.  She has started to try and portion out her foods.  She does try to limit her bread intake by eating half.    Preferred Learning Style:  No preference indicated   Learning Readiness:   Change in progress   MEDICATIONS: see list   DIETARY INTAKE:  Usual eating pattern includes 3 meals and 2 snacks per day.  Avoided foods include kiwi (adverse reaction), sweets and sodas, bread.    24-hr recall:  B ( AM): biscuit, but just the meat and not the bread with hashbrown and OJ  Snk ( AM): Nabs  L ( PM): grilled chicken salad from [place], [place] food Snk ( PM): yogurt or clementine D ( PM): baked pork chop or bake chicken with rice or green beans or corn or baked beans Snk ( PM): not usually Beverages: green tea, water  Usual physical activity: none outside ADLs  Estimated energy needs: 1800 calories 200 g carbohydrates 135 g protein 50 g fat  Progress Towards Goal(s):  In progress.   Nutritional Diagnosis:  NB-2.1 Physical inactivity As related to sedentary lifestyle.  As evidenced by patient report.    Intervention:  Nutrition counseling provided.  Discussed physiology of diabetes and role of lifestyle modification on preventing/delaying diagnosis.  Recommended 3 meals/day and to avoid meal skipping .  Suggested snacks as needed.  Discussed MyPlate recommendations,  focusing on whole grains, lean proteins, and more non-starchy vegetables.  Educated client on what foods affect blood glucose levels, carb counting, and reading food labels.  Discussed options for eating out and beverage choices.  Strongly recommended increasing physical activity where able.  Joy Pittman agreed to walk around the track when she takes her son to football practice.   Teaching Method Utilized:  Visual Auditory  Handouts given during visit include: Living Well with Diabetes 15 g carb snacks Meal Plan Card   Barriers to learning/adherence to lifestyle change: meal skipping and time for physical activity  Demonstrated degree of understanding via:  Teach Back   Monitoring/Evaluation:  Dietary intake, exercise, labs, and body weight prn.

## 2014-09-04 ENCOUNTER — Emergency Department (HOSPITAL_COMMUNITY): Payer: Medicaid Other

## 2014-09-04 ENCOUNTER — Encounter (HOSPITAL_COMMUNITY): Payer: Self-pay | Admitting: *Deleted

## 2014-09-04 ENCOUNTER — Emergency Department (HOSPITAL_COMMUNITY)
Admission: EM | Admit: 2014-09-04 | Discharge: 2014-09-04 | Disposition: A | Discharge status: Home / Self Care | Payer: Medicaid Other | Attending: Emergency Medicine | Admitting: Emergency Medicine

## 2014-09-04 DIAGNOSIS — R52 Pain, unspecified: Secondary | ICD-10-CM | POA: Diagnosis present

## 2014-09-04 DIAGNOSIS — F909 Attention-deficit hyperactivity disorder, unspecified type: Secondary | ICD-10-CM | POA: Insufficient documentation

## 2014-09-04 DIAGNOSIS — R059 Cough, unspecified: Secondary | ICD-10-CM

## 2014-09-04 DIAGNOSIS — F319 Bipolar disorder, unspecified: Secondary | ICD-10-CM | POA: Insufficient documentation

## 2014-09-04 DIAGNOSIS — G43909 Migraine, unspecified, not intractable, without status migrainosus: Secondary | ICD-10-CM | POA: Diagnosis not present

## 2014-09-04 DIAGNOSIS — Z79899 Other long term (current) drug therapy: Secondary | ICD-10-CM | POA: Diagnosis not present

## 2014-09-04 DIAGNOSIS — Z87440 Personal history of urinary (tract) infections: Secondary | ICD-10-CM | POA: Insufficient documentation

## 2014-09-04 DIAGNOSIS — Z880 Allergy status to penicillin: Secondary | ICD-10-CM | POA: Insufficient documentation

## 2014-09-04 DIAGNOSIS — M791 Myalgia, unspecified site: Secondary | ICD-10-CM

## 2014-09-04 DIAGNOSIS — J0110 Acute frontal sinusitis, unspecified: Secondary | ICD-10-CM

## 2014-09-04 DIAGNOSIS — R05 Cough: Secondary | ICD-10-CM

## 2014-09-04 MED ORDER — IBUPROFEN 800 MG PO TABS
800.0000 mg | ORAL_TABLET | Freq: Once | ORAL | Status: AC
  Administered 2014-09-04: 800 mg via ORAL
  Filled 2014-09-04: qty 1

## 2014-09-04 MED ORDER — HYDROCODONE-HOMATROPINE 5-1.5 MG/5ML PO SYRP: 5.0000 mL | ORAL_SOLUTION | Freq: Four times a day (QID) | ORAL | Status: DC | PRN

## 2014-09-04 MED ORDER — HYDROCODONE-HOMATROPINE 5-1.5 MG/5ML PO SYRP
5.0000 mL | ORAL_SOLUTION | Freq: Once | ORAL | Status: AC
  Administered 2014-09-04: 5 mL via ORAL
  Filled 2014-09-04: qty 5

## 2014-09-04 MED ORDER — BUTALBITAL-APAP-CAFFEINE 50-325-40 MG PO TABS
1.0000 | ORAL_TABLET | Freq: Once | ORAL | Status: AC
  Administered 2014-09-04: 1 via ORAL
  Filled 2014-09-04: qty 1

## 2014-09-04 NOTE — Discharge Instructions (Signed)
1. Medications: Mucinex, Hycodan, usual home medications including completing your antibiotic 2. Treatment: rest, drink plenty of fluids, take tylenol or ibuprofen for fever control 3. Follow Up: Please followup with your primary doctor in 3 days for discussion of your diagnoses and further evaluation after today's visit; if you do not have a primary care doctor use the resource guide provided to find one; Return to the ER for high fevers, difficulty breathing or other concerning symptoms   Upper Respiratory Infection, Adult An upper respiratory infection (URI) is also sometimes known as the common cold. The upper respiratory tract includes the nose, sinuses, throat, trachea, and bronchi. Bronchi are the airways leading to the lungs. Most people improve within 1 week, but symptoms can last up to 2 weeks. A residual cough may last even longer.  CAUSES Many different viruses can infect the tissues lining the upper respiratory tract. The tissues become irritated and inflamed and often become very moist. Mucus production is also common. A cold is contagious. You can easily spread the virus to others by oral contact. This includes kissing, sharing a glass, coughing, or sneezing. Touching your mouth or nose and then touching a surface, which is then touched by another person, can also spread the virus. SYMPTOMS  Symptoms typically develop 1 to 3 days after you come in contact with a cold virus. Symptoms vary from person to person. They may include:  Runny nose.  Sneezing.  Nasal congestion.  Sinus irritation.  Sore throat.  Loss of voice (laryngitis).  Cough.  Fatigue.  Muscle aches.  Loss of appetite.  Headache.  Low-grade fever. DIAGNOSIS  You might diagnose your own cold based on familiar symptoms, since most people get a cold 2 to 3 times a year. Your caregiver can confirm this based on your exam. Most importantly, your caregiver can check that your symptoms are not due to another  disease such as strep throat, sinusitis, pneumonia, asthma, or epiglottitis. Blood tests, throat tests, and X-rays are not necessary to diagnose a common cold, but they may sometimes be helpful in excluding other more serious diseases. Your caregiver will decide if any further tests are required. RISKS AND COMPLICATIONS  You may be at risk for a more severe case of the common cold if you smoke cigarettes, have chronic heart disease (such as heart failure) or lung disease (such as asthma), or if you have a weakened immune system. The very young and very old are also at risk for more serious infections. Bacterial sinusitis, middle ear infections, and bacterial pneumonia can complicate the common cold. The common cold can worsen asthma and chronic obstructive pulmonary disease (COPD). Sometimes, these complications can require emergency medical care and may be life-threatening. PREVENTION  The best way to protect against getting a cold is to practice good hygiene. Avoid oral or hand contact with people with cold symptoms. Wash your hands often if contact occurs. There is no clear evidence that vitamin C, vitamin E, echinacea, or exercise reduces the chance of developing a cold. However, it is always recommended to get plenty of rest and practice good nutrition. TREATMENT  Treatment is directed at relieving symptoms. There is no cure. Antibiotics are not effective, because the infection is caused by a virus, not by bacteria. Treatment may include:  Increased fluid intake. Sports drinks offer valuable electrolytes, sugars, and fluids.  Breathing heated mist or steam (vaporizer or shower).  Eating chicken soup or other clear broths, and maintaining good nutrition.  Getting plenty of rest.  Using gargles or lozenges for comfort.  Controlling fevers with ibuprofen or acetaminophen as directed by your caregiver.  Increasing usage of your inhaler if you have asthma. Zinc gel and zinc lozenges, taken in  the first 24 hours of the common cold, can shorten the duration and lessen the severity of symptoms. Pain medicines may help with fever, muscle aches, and throat pain. A variety of non-prescription medicines are available to treat congestion and runny nose. Your caregiver can make recommendations and may suggest nasal or lung inhalers for other symptoms.  HOME CARE INSTRUCTIONS   Only take over-the-counter or prescription medicines for pain, discomfort, or fever as directed by your caregiver.  Use a warm mist humidifier or inhale steam from a shower to increase air moisture. This may keep secretions moist and make it easier to breathe.  Drink enough water and fluids to keep your urine clear or pale yellow.  Rest as needed.  Return to work when your temperature has returned to normal or as your caregiver advises. You may need to stay home longer to avoid infecting others. You can also use a face mask and careful hand washing to prevent spread of the virus. SEEK MEDICAL CARE IF:   After the first few days, you feel you are getting worse rather than better.  You need your caregiver's advice about medicines to control symptoms.  You develop chills, worsening shortness of breath, or brown or red sputum. These may be signs of pneumonia.  You develop yellow or brown nasal discharge or pain in the face, especially when you bend forward. These may be signs of sinusitis.  You develop a fever, swollen neck glands, pain with swallowing, or white areas in the back of your throat. These may be signs of strep throat. SEEK IMMEDIATE MEDICAL CARE IF:   You have a fever.  You develop severe or persistent headache, ear pain, sinus pain, or chest pain.  You develop wheezing, a prolonged cough, cough up blood, or have a change in your usual mucus (if you have chronic lung disease).  You develop sore muscles or a stiff neck. Document Released: 06/29/2000 Document Revised: 03/28/2011 Document Reviewed:  04/10/2013 [facility] Patient Information 2015 [place], [place]. This information is not intended to replace advice given to you by your health care provider. Make sure you discuss any questions you have with your health care provider.

## 2014-09-04 NOTE — ED Notes (Signed)
Patient states that her body has been aching for about 2 weeks and she went to see her primary last Thursday and was diagnosed with a sinus infection. Patient was prescribed antibiotics cefdinir for the sinus infection which she is still currently taking. Patients states that she has a cough and a fever of 101 last night. She is also taking sudafed over the counter and does not feel any better.

## 2014-09-04 NOTE — ED Notes (Signed)
Patient transported to X-ray 

## 2014-09-04 NOTE — ED Provider Notes (Signed)
CSN: [id number]     Arrival date & time 09/04/14  6962 History   First MD Initiated Contact with Patient 09/04/14 [phone]     Chief Complaint  Patient presents with  . Generalized Body Aches     (Consider location/radiation/quality/duration/timing/severity/associated sxs/prior Treatment) The history is provided by the patient and medical records. No language interpreter was used.     Joy Pittman is a 34 y.o. female  with a hx of PTSD, ADHD, complicated migraine presents to the Emergency Department complaining of gradual, persistent, progressively worsening URI symptoms onset 2 weeks ago. Associated symptoms include body aches, cold chills, sinus congestion, post nasal drip, sore throat, fatigue, cough, frontal headache.  Pt reports she was seen at an urgent care and placed on Cefdinir x 5 days.  Pt reports fever last night to 101 and treated this with tylenol around 5am this morning.  Pt reports rib pain from the coughing.  Pt reports taking Nyquil, robitussin and sudafed with minimal relief.  No aggravating or alleviating factors.  Pt reports her daughter has similar symptoms.  Pt denies neck pain, neck stiffness, CP, SOB, abd pain, N/V/D, weakness, dizziness, syncope, dysuria.  LMP: 08/16/14.  Pt denies known tick bites, rashes or joint swelling.     Past Medical History  Diagnosis Date  . Bipolar 1 disorder   . PTSD (post-traumatic stress disorder)   . ADHD (attention deficit hyperactivity disorder)   . UTI (lower urinary tract infection)    Past Surgical History  Procedure Laterality Date  . Ectopic pregnancy surgery    . Tubal ligation    . Laparoscopy     Family History  Problem Relation Age of Onset  . Cancer Other   . Hypertension Other   . Diabetes Other   . Drug abuse Paternal Grandfather    Social History  Substance Use Topics  . Smoking status: Never Smoker   . Smokeless tobacco: Never Used  . Alcohol Use: No   OB History    Gravida Para Term Preterm AB TAB SAB  Ectopic Multiple Living   4 3 3  1   1  3      Review of Systems  Constitutional: Positive for fatigue. Negative for fever, chills, diaphoresis, appetite change and unexpected weight change.  HENT: Positive for congestion, postnasal drip, rhinorrhea, sinus pressure and sore throat. Negative for ear discharge, ear pain and mouth sores.   Eyes: Negative for visual disturbance.  Respiratory: Positive for cough. Negative for chest tightness, shortness of breath, wheezing and stridor.   Cardiovascular: Negative for chest pain, palpitations and leg swelling.  Gastrointestinal: Negative for nausea, vomiting, abdominal pain, diarrhea and constipation.  Endocrine: Negative for polydipsia, polyphagia and polyuria.  Genitourinary: Negative for dysuria, urgency, frequency and hematuria.  Musculoskeletal: Positive for myalgias. Negative for back pain, arthralgias and neck stiffness.  Skin: Negative for rash.  Allergic/Immunologic: Negative for immunocompromised state.  Neurological: Positive for headaches. Negative for syncope, light-headedness and numbness.  Hematological: Negative for adenopathy. Does not bruise/bleed easily.  Psychiatric/Behavioral: Negative for sleep disturbance. The patient is not nervous/anxious.   All other systems reviewed and are negative.     Allergies  Kiwi extract and Penicillins  Home Medications   Prior to Admission medications   Medication Sig Start Date End Date Taking? Authorizing Provider  acetaminophen (TYLENOL) 500 MG tablet Take 1,000 mg by mouth every 6 (six) hours as needed for moderate pain.   Yes Historical Provider, MD  ADDERALL XR 20 MG 24  hr capsule Take 1 capsule by mouth 2 (two) times daily. 08/11/14  Yes Historical Provider, MD  cefdinir (OMNICEF) 300 MG capsule Take 1 capsule by mouth 2 (two) times daily. For 10 days 08/30/14 09/09/14 Yes Historical Provider, MD  diclofenac sodium (VOLTAREN) 1 % GEL Apply 1 application topically 4 (four) times  daily. Patient taking differently: Apply 1 application topically 4 (four) times daily as needed (pain).  10/08/13  Yes Janne Napoleon, NP  FLUoxetine (PROZAC) 40 MG capsule Take 40 mg by mouth daily. 08/03/14  Yes Historical Provider, MD  LORazepam (ATIVAN) 1 MG tablet Take 1 mg by mouth every 8 (eight) hours as needed. For anxiety.   Yes Historical Provider, MD  Lurasidone HCl 60 MG TABS Take 60 mg by mouth every evening.    Yes Historical Provider, MD  phenazopyridine (PYRIDIUM) 100 MG tablet Take 1-2 tablets (100-200 mg total) by mouth 3 (three) times daily as needed for pain. 03/04/14  Yes Waldemar Dickens, MD  propranolol (INDERAL) 20 MG tablet Take 20 mg by mouth 3 (three) times daily.   Yes Historical Provider, MD  diclofenac (CATAFLAM) 50 MG tablet Take 1 tablet (50 mg total) by mouth 3 (three) times daily. Patient not taking: Reported on 06/13/2014 05/16/12   Lavonia Drafts, MD  HYDROcodone-homatropine [facility]) 5-1.5 MG/5ML syrup Take 5 mLs by mouth every 6 (six) hours as needed for cough. 09/04/14   Kentley Cedillo, PA-C  senna-docusate (SENOKOT-S) 8.6-50 MG per tablet Take 1 tablet by mouth 2 (two) times daily. Patient not taking: Reported on 07/29/2014 06/13/14   Morene Crocker, CNM  traMADol (ULTRAM) 50 MG tablet Take 1 tablet (50 mg total) by mouth every 6 (six) hours as needed. Patient not taking: Reported on 06/13/2014 10/08/13   Janne Napoleon, NP   BP 108/65 mmHg  Pulse 62  Temp(Src) 98.8 F (37.1 C) (Oral)  Resp 20  SpO2 100%  LMP 08/16/2014 (Exact Date) Physical Exam  Constitutional: She is oriented to person, place, and time. She appears well-developed and well-nourished. No distress.  HENT:  Head: Normocephalic and atraumatic.  Right Ear: Tympanic membrane, external ear and ear canal normal.  Left Ear: Tympanic membrane, external ear and ear canal normal.  Nose: Mucosal edema and rhinorrhea present. No epistaxis. Right sinus exhibits no maxillary sinus tenderness and no  frontal sinus tenderness. Left sinus exhibits no maxillary sinus tenderness and no frontal sinus tenderness.  Mouth/Throat: Uvula is midline, oropharynx is clear and moist and mucous membranes are normal. Mucous membranes are not pale and not cyanotic. No oropharyngeal exudate, posterior oropharyngeal edema, posterior oropharyngeal erythema or tonsillar abscesses.  Eyes: Conjunctivae and EOM are normal. Pupils are equal, round, and reactive to light. No scleral icterus.  No horizontal, vertical or rotational nystagmus  Neck: Normal range of motion and full passive range of motion without pain. Neck supple.  Full active and passive ROM without pain No midline or paraspinal tenderness No nuchal rigidity or meningeal signs  Cardiovascular: Normal rate, regular rhythm, normal heart sounds and intact distal pulses.   No murmur heard. Pulmonary/Chest: Effort normal and breath sounds normal. No stridor. No respiratory distress. She has no wheezes. She has no rales.  Clear and equal breath sounds without focal wheezes, rhonchi, rales  Abdominal: Soft. Bowel sounds are normal. There is no tenderness. There is no rebound and no guarding.  Musculoskeletal: Normal range of motion.  Lymphadenopathy:    She has no cervical adenopathy.  Neurological: She is alert and oriented  to person, place, and time. She has normal reflexes. No cranial nerve deficit. She exhibits normal muscle tone. Coordination normal.  Mental Status:  Alert, oriented, thought content appropriate. Speech fluent without evidence of aphasia. Able to follow 2 step commands without difficulty.  Cranial Nerves:  II:  Peripheral visual fields grossly normal, pupils equal, round, reactive to light III,IV, VI: ptosis not present, extra-ocular motions intact bilaterally  V,VII: smile symmetric, facial light touch sensation equal VIII: hearing grossly normal bilaterally  IX,X: midline uvula rise  XI: bilateral shoulder shrug equal and  strong XII: midline tongue extension  Motor:  5/5 in upper and lower extremities bilaterally including strong and equal grip strength and dorsiflexion/plantar flexion Sensory: Pinprick and light touch normal in all extremities.  Deep Tendon Reflexes: 2+ and symmetric  Cerebellar: normal finger-to-nose with bilateral upper extremities Gait: normal gait and balance CV: distal pulses palpable throughout   Skin: Skin is warm and dry. No rash noted. She is not diaphoretic.  Psychiatric: She has a normal mood and affect. Her behavior is normal. Judgment and thought content normal.  Nursing note and vitals reviewed.   ED Course  Procedures (including critical care time) Labs Review Labs Reviewed - No data to display  Imaging Review Dg Chest 2 View  09/04/2014   CLINICAL DATA:  Patient with sinus infection for 2 weeks. Constant cough.  EXAM: CHEST  2 VIEW  COMPARISON:  Chest radiograph 06/13/2012  FINDINGS: Normal cardiac and mediastinal contours. No consolidative pulmonary opacities. No pleural effusion or pneumothorax. Regional skeleton is unremarkable.  IMPRESSION: No acute cardiopulmonary process.   Electronically Signed   By: Lovey Newcomer M.D.   On: 09/04/2014 10:04   I have personally reviewed and evaluated these images and lab results as part of my medical decision-making.   EKG Interpretation None      MDM   Final diagnoses:  Acute frontal sinusitis, recurrence not specified  Cough  Migraine without status migrainosus, not intractable, unspecified migraine type  Myalgia   Joy Pittman  Presents with frontal sinus pressure, cough and general malaise.  She reports fever at home, but none here in the ED. Clear and equal breath sounds.  Will obtain CXR, give cough medication and reassess.    11:08 AM Pt continues to c/o myalgias.  She is now also c/o headache and right sided paresthesias.  Pt with a hx of migraine headaches and associated paresthesias.  She reports these usually  resolved with her home medications.  Pt with a normal neurologic exam; no deficits.  She ambulates without difficulty.  Highly doubt CVA.  Headache treated here in the ED.  Presentation is like pts typical HA and non concerning for [facility], ICH, Meningitis, or temporal arteritis. Pt is afebrile with no focal neuro deficits, nuchal rigidity, or change in vision  Pt CXR negative for acute infiltrate. Patients symptoms are consistent with URI, likely viral etiology. Pt is already taking Omnicef to treat any possible infection.  Pt will be discharged with symptomatic treatment.  Verbalizes understanding and is agreeable with plan. Pt is hemodynamically stable & in NAD prior to dc.    BP 108/65 mmHg  Pulse 62  Temp(Src) 98.8 F (37.1 C) (Oral)  Resp 20  SpO2 100%  LMP 08/16/2014 (Exact Date)  The patient was discussed with and seen by Dr. Johnney Killian who agrees with the treatment plan.    Jarrett Soho Cleora Karnik, PA-C 09/04/14 1643  Charlesetta Shanks, MD 09/06/14 1743

## 2014-09-18 ENCOUNTER — Ambulatory Visit: Payer: Medicaid Other | Admitting: Certified Nurse Midwife

## 2014-09-19 ENCOUNTER — Telehealth: Payer: Self-pay | Admitting: Obstetrics

## 2014-09-19 ENCOUNTER — Ambulatory Visit: Payer: Medicaid Other | Admitting: Certified Nurse Midwife

## 2014-09-23 ENCOUNTER — Ambulatory Visit (INDEPENDENT_AMBULATORY_CARE_PROVIDER_SITE_OTHER): Payer: Medicaid Other | Admitting: Certified Nurse Midwife

## 2014-09-23 ENCOUNTER — Encounter: Payer: Self-pay | Admitting: Certified Nurse Midwife

## 2014-09-23 VITALS — BP 101/67 | HR 69 | Temp 99.3°F | Wt 194.0 lb

## 2014-09-23 DIAGNOSIS — B3731 Acute candidiasis of vulva and vagina: Secondary | ICD-10-CM

## 2014-09-23 DIAGNOSIS — B373 Candidiasis of vulva and vagina: Secondary | ICD-10-CM

## 2014-09-23 MED ORDER — FLUCONAZOLE 100 MG PO TABS: 100.0000 mg | ORAL_TABLET | Freq: Once | ORAL | Status: DC

## 2014-09-23 MED ORDER — TERCONAZOLE 0.4 % VA CREA: 1.0000 | TOPICAL_CREAM | Freq: Every day | VAGINAL | Status: DC

## 2014-09-23 NOTE — Addendum Note (Signed)
Addended by: Lewie Loron D on: 09/23/2014 03:24 PM   Modules accepted: Orders

## 2014-09-23 NOTE — Progress Notes (Signed)
Patient ID: MEGEN MADEWELL, female   DOB: 05/19/1980, 34 y.o.   MRN: [id number]  Patient ID: TIENNA BIENKOWSKI, female   DOB: Jun 14, 1980, 34 y.o.   MRN: [id number]  Chief Complaint  Patient presents with  . Vaginitis    possible yeast inf after antibiotics    HPI GILIANA VANTIL is a 34 y.o. female.  Here with c/o vaginal discharge and itching for about 10 days.  Was recently on antibiotics for an acute sinusitis infection.  Denies any odor.  Had a BTL.     HPI  Past Medical History  Diagnosis Date  . Bipolar 1 disorder   . PTSD (post-traumatic stress disorder)   . ADHD (attention deficit hyperactivity disorder)   . UTI (lower urinary tract infection)     Past Surgical History  Procedure Laterality Date  . Ectopic pregnancy surgery    . Tubal ligation    . Laparoscopy      Family History  Problem Relation Age of Onset  . Cancer Other   . Hypertension Other   . Diabetes Other   . Drug abuse Paternal Grandfather     Social History Social History  Substance Use Topics  . Smoking status: Never Smoker   . Smokeless tobacco: Never Used  . Alcohol Use: No    Allergies  Allergen Reactions  . Kiwi Extract   . Penicillins Hives    Tolerates amoxacillin     Current Outpatient Prescriptions  Medication Sig Dispense Refill  . acetaminophen (TYLENOL) 500 MG tablet Take 1,000 mg by mouth every 6 (six) hours as needed for moderate pain.    . ADDERALL XR 20 MG 24 hr capsule Take 1 capsule by mouth 2 (two) times daily.  0  . FLUoxetine (PROZAC) 40 MG capsule Take 40 mg by mouth daily.  1  . LORazepam (ATIVAN) 1 MG tablet Take 1 mg by mouth every 8 (eight) hours as needed. For anxiety.    . Lurasidone HCl 60 MG TABS Take 60 mg by mouth every evening.     . phenazopyridine (PYRIDIUM) 100 MG tablet Take 1-2 tablets (100-200 mg total) by mouth 3 (three) times daily as needed for pain. 30 tablet 0  . propranolol (INDERAL) 20 MG tablet Take 20 mg by mouth 3 (three) times daily.    .  diclofenac (CATAFLAM) 50 MG tablet Take 1 tablet (50 mg total) by mouth 3 (three) times daily. (Patient not taking: Reported on 06/13/2014) 40 tablet 1  . diclofenac sodium (VOLTAREN) 1 % GEL Apply 1 application topically 4 (four) times daily. (Patient not taking: Reported on 09/23/2014) 100 g 0  . fluconazole (DIFLUCAN) 100 MG tablet Take 1 tablet (100 mg total) by mouth once. Repeat dose in 48-72 hour. 3 tablet 0  . HYDROcodone-homatropine (HYCODAN) 5-1.5 MG/5ML syrup Take 5 mLs by mouth every 6 (six) hours as needed for cough. (Patient not taking: Reported on 09/23/2014) 120 mL 0  . senna-docusate (SENOKOT-S) 8.6-50 MG per tablet Take 1 tablet by mouth 2 (two) times daily. (Patient not taking: Reported on 07/29/2014) 60 tablet 12  . terconazole (TERAZOL 7) 0.4 % vaginal cream Place 1 applicator vaginally at bedtime. 45 g 0  . traMADol (ULTRAM) 50 MG tablet Take 1 tablet (50 mg total) by mouth every 6 (six) hours as needed. (Patient not taking: Reported on 06/13/2014) 20 tablet 0   No current facility-administered medications for this visit.    Review of Systems Review of Systems Constitutional: negative for  fatigue and weight loss Respiratory: negative for cough and wheezing Cardiovascular: negative for chest pain, fatigue and palpitations Gastrointestinal: negative for abdominal pain and change in bowel habits Genitourinary:negative Integument/breast: negative for nipple discharge Musculoskeletal:negative for myalgias Neurological: negative for gait problems and tremors Behavioral/Psych: negative for abusive relationship, depression Endocrine: negative for temperature intolerance     Blood pressure 101/67, pulse 69, temperature 99.3 F (37.4 C), weight 194 lb (87.998 kg), last menstrual period 09/16/2014.  Physical Exam Physical Exam General:   alert  Skin:   no rash or abnormalities  Lungs:   clear to auscultation bilaterally  Heart:   regular rate and rhythm, S1, S2 normal, no murmur,  click, rub or gallop  Breasts:   normal without suspicious masses, skin or nipple changes or axillary nodes  Abdomen:  normal findings: no organomegaly, soft, non-tender and no hernia  Pelvis:  External genitalia: normal general appearance Urinary system: urethral meatus normal and bladder without fullness, nontender Vaginal: normal without tenderness, induration or masses Cervix: normal appearance Adnexa: normal bimanual exam Uterus: anteverted and non-tender, normal size    50% of 15 min visit spent on counseling and coordination of care.   Data Reviewed Previous medical hx, labs, meds  Assessment     VVC     Plan    No orders of the defined types were placed in this encounter.   Meds ordered this encounter  Medications  . fluconazole (DIFLUCAN) 100 MG tablet    Sig: Take 1 tablet (100 mg total) by mouth once. Repeat dose in 48-72 hour.    Dispense:  3 tablet    Refill:  0  . terconazole (TERAZOL 7) 0.4 % vaginal cream    Sig: Place 1 applicator vaginally at bedtime.    Dispense:  45 g    Refill:  0    Follow up as needed.

## 2014-09-24 NOTE — Telephone Encounter (Signed)
[id number] - Patient scheduled and kept appointment today. brm

## 2014-09-27 ENCOUNTER — Other Ambulatory Visit: Payer: Self-pay | Admitting: Certified Nurse Midwife

## 2014-09-27 DIAGNOSIS — A5901 Trichomonal vulvovaginitis: Secondary | ICD-10-CM

## 2014-09-27 DIAGNOSIS — B9689 Other specified bacterial agents as the cause of diseases classified elsewhere: Secondary | ICD-10-CM

## 2014-09-27 DIAGNOSIS — N760 Acute vaginitis: Principal | ICD-10-CM

## 2014-09-27 LAB — SURESWAB, VAGINOSIS/VAGINITIS PLUS
ATOPOBIUM VAGINAE: 6.7 Log (cells/mL)
C. ALBICANS, DNA: NOT DETECTED
C. PARAPSILOSIS, DNA: NOT DETECTED
C. TRACHOMATIS RNA, TMA: NOT DETECTED
C. TROPICALIS, DNA: NOT DETECTED
C. glabrata, DNA: NOT DETECTED
Gardnerella vaginalis: >8 Log (cells/mL)
LACTOBACILLUS SPECIES: NOT DETECTED Log (cells/mL)
MEGASPHAERA SPECIES: 4.7 Log (cells/mL)
N. GONORRHOEAE RNA, TMA: NOT DETECTED
T. vaginalis RNA, QL TMA: DETECTED — AB

## 2014-09-27 MED ORDER — TINIDAZOLE 500 MG PO TABS: 2.0000 g | ORAL_TABLET | Freq: Every day | ORAL | Status: AC

## 2014-10-01 ENCOUNTER — Ambulatory Visit: Payer: Medicaid Other | Admitting: Certified Nurse Midwife

## 2015-04-09 ENCOUNTER — Encounter (HOSPITAL_COMMUNITY): Payer: Self-pay

## 2015-04-09 ENCOUNTER — Emergency Department (HOSPITAL_COMMUNITY): Payer: Medicaid Other

## 2015-04-09 ENCOUNTER — Emergency Department (HOSPITAL_COMMUNITY)
Admission: EM | Admit: 2015-04-09 | Discharge: 2015-04-10 | Disposition: A | Discharge status: Home / Self Care | Payer: Medicaid Other | Attending: Emergency Medicine | Admitting: Emergency Medicine

## 2015-04-09 DIAGNOSIS — R51 Headache: Secondary | ICD-10-CM | POA: Diagnosis present

## 2015-04-09 DIAGNOSIS — Z79899 Other long term (current) drug therapy: Secondary | ICD-10-CM | POA: Diagnosis not present

## 2015-04-09 DIAGNOSIS — N390 Urinary tract infection, site not specified: Secondary | ICD-10-CM | POA: Insufficient documentation

## 2015-04-09 DIAGNOSIS — Z880 Allergy status to penicillin: Secondary | ICD-10-CM | POA: Diagnosis not present

## 2015-04-09 DIAGNOSIS — F319 Bipolar disorder, unspecified: Secondary | ICD-10-CM | POA: Insufficient documentation

## 2015-04-09 DIAGNOSIS — J3489 Other specified disorders of nose and nasal sinuses: Secondary | ICD-10-CM | POA: Insufficient documentation

## 2015-04-09 DIAGNOSIS — R112 Nausea with vomiting, unspecified: Secondary | ICD-10-CM | POA: Insufficient documentation

## 2015-04-09 DIAGNOSIS — R05 Cough: Secondary | ICD-10-CM | POA: Diagnosis not present

## 2015-04-09 DIAGNOSIS — M791 Myalgia: Secondary | ICD-10-CM | POA: Diagnosis not present

## 2015-04-09 DIAGNOSIS — R5383 Other fatigue: Secondary | ICD-10-CM | POA: Insufficient documentation

## 2015-04-09 DIAGNOSIS — R6889 Other general symptoms and signs: Secondary | ICD-10-CM

## 2015-04-09 LAB — URINALYSIS, ROUTINE W REFLEX MICROSCOPIC
BILIRUBIN URINE: NEGATIVE
GLUCOSE, UA: NEGATIVE mg/dL
Ketones, ur: NEGATIVE mg/dL
Nitrite: NEGATIVE
PH: 6 (ref 5.0–8.0)
Protein, ur: NEGATIVE mg/dL
SPECIFIC GRAVITY, URINE: 1.02 (ref 1.005–1.030)

## 2015-04-09 LAB — CBC WITH DIFFERENTIAL/PLATELET
Basophils Absolute: 0 10*3/uL (ref 0.0–0.1)
Basophils Relative: 0 %
Eosinophils Absolute: 0 10*3/uL (ref 0.0–0.7)
Eosinophils Relative: 0 %
HCT: 34.1 % — ABNORMAL LOW (ref 36.0–46.0)
Hemoglobin: 11 g/dL — ABNORMAL LOW (ref 12.0–15.0)
LYMPHS ABS: 0.6 10*3/uL — AB (ref 0.7–4.0)
LYMPHS PCT: 16 %
MCH: 26.2 pg (ref 26.0–34.0)
MCHC: 32.3 g/dL (ref 30.0–36.0)
MCV: 81.2 fL (ref 78.0–100.0)
MONO ABS: 0.6 10*3/uL (ref 0.1–1.0)
Monocytes Relative: 17 %
Neutro Abs: 2.6 10*3/uL (ref 1.7–7.7)
Neutrophils Relative %: 67 %
PLATELETS: 237 10*3/uL (ref 150–400)
RBC: 4.2 MIL/uL (ref 3.87–5.11)
RDW: 15.8 % — AB (ref 11.5–15.5)
WBC: 3.9 10*3/uL — ABNORMAL LOW (ref 4.0–10.5)

## 2015-04-09 LAB — BASIC METABOLIC PANEL
Anion gap: 9 (ref 5–15)
BUN: 6 mg/dL (ref 6–20)
CALCIUM: 8.8 mg/dL — AB (ref 8.9–10.3)
CO2: 22 mmol/L (ref 22–32)
Chloride: 104 mmol/L (ref 101–111)
Creatinine, Ser: 0.61 mg/dL (ref 0.44–1.00)
GFR calc Af Amer: >60 mL/min (ref >60)
Glucose, Bld: 105 mg/dL — ABNORMAL HIGH (ref 65–99)
POTASSIUM: 3.7 mmol/L (ref 3.5–5.1)
Sodium: 135 mmol/L (ref 135–145)

## 2015-04-09 LAB — URINE MICROSCOPIC-ADD ON

## 2015-04-09 LAB — RAPID STREP SCREEN (MED CTR MEBANE ONLY): Streptococcus, Group A Screen (Direct): NEGATIVE

## 2015-04-09 MED ORDER — ACETAMINOPHEN 500 MG PO TABS
1000.0000 mg | ORAL_TABLET | Freq: Once | ORAL | Status: AC
  Administered 2015-04-09: 1000 mg via ORAL
  Filled 2015-04-09: qty 2

## 2015-04-09 MED ORDER — ONDANSETRON HCL 4 MG PO TABS: 4.0000 mg | ORAL_TABLET | Freq: Four times a day (QID) | ORAL | Status: DC

## 2015-04-09 MED ORDER — SULFAMETHOXAZOLE-TRIMETHOPRIM 800-160 MG PO TABS: 1.0000 | ORAL_TABLET | Freq: Two times a day (BID) | ORAL | Status: AC

## 2015-04-09 NOTE — ED Notes (Signed)
Pt took ibuprofen at 6pm prior to arrival

## 2015-04-09 NOTE — ED Notes (Signed)
Pt complains of fevers, body aches, vomiting for about three days

## 2015-04-09 NOTE — Discharge Instructions (Signed)
Urinary Tract Infection Urinary tract infections (UTIs) can develop anywhere along your urinary tract. Your urinary tract is your body's drainage system for removing wastes and extra water. Your urinary tract includes two kidneys, two ureters, a bladder, and a urethra. Your kidneys are a pair of bean-shaped organs. Each kidney is about the size of your fist. They are located below your ribs, one on each side of your spine. CAUSES Infections are caused by microbes, which are microscopic organisms, including fungi, viruses, and bacteria. These organisms are so small that they can only be seen through a microscope. Bacteria are the microbes that most commonly cause UTIs. SYMPTOMS  Symptoms of UTIs may vary by age and gender of the patient and by the location of the infection. Symptoms in young women typically include a frequent and intense urge to urinate and a painful, burning feeling in the bladder or urethra during urination. Older women and men are more likely to be tired, shaky, and weak and have muscle aches and abdominal pain. A fever may mean the infection is in your kidneys. Other symptoms of a kidney infection include pain in your back or sides below the ribs, nausea, and vomiting. DIAGNOSIS To diagnose a UTI, your caregiver will ask you about your symptoms. Your caregiver will also ask you to provide a urine sample. The urine sample will be tested for bacteria and white blood cells. White blood cells are made by your body to help fight infection. TREATMENT  Typically, UTIs can be treated with medication. Because most UTIs are caused by a bacterial infection, they usually can be treated with the use of antibiotics. The choice of antibiotic and length of treatment depend on your symptoms and the type of bacteria causing your infection. HOME CARE INSTRUCTIONS  If you were prescribed antibiotics, take them exactly as your caregiver instructs you. Finish the medication even if you feel better after  you have only taken some of the medication.  Drink enough water and fluids to keep your urine clear or pale yellow.  Avoid caffeine, tea, and carbonated beverages. They tend to irritate your bladder.  Empty your bladder often. Avoid holding urine for long periods of time.  Empty your bladder before and after sexual intercourse.  After a bowel movement, women should cleanse from front to back. Use each tissue only once. SEEK MEDICAL CARE IF:   You have back pain.  You develop a fever.  Your symptoms do not begin to resolve within 3 days. SEEK IMMEDIATE MEDICAL CARE IF:   You have severe back pain or lower abdominal pain.  You develop chills.  You have nausea or vomiting.  You have continued burning or discomfort with urination. MAKE SURE YOU:   Understand these instructions.  Will watch your condition.  Will get help right away if you are not doing well or get worse.   This information is not intended to replace advice given to you by your health care provider. Make sure you discuss any questions you have with your health care provider.   Document Released: 10/13/2004 Document Revised: 09/24/2014 Document Reviewed: 02/11/2011 Elsevier Interactive Patient Education 2016 [employer] Viral Infections A viral infection can be caused by different types of viruses.Most viral infections are not serious and resolve on their own. However, some infections may cause severe symptoms and may lead to further complications. SYMPTOMS Viruses can frequently cause:  Minor sore throat.  Aches and pains.  Headaches.  Runny nose.  Different types of rashes.  Watery  eyes.  Tiredness.  Cough.  Loss of appetite.  Gastrointestinal infections, resulting in nausea, vomiting, and diarrhea. These symptoms do not respond to antibiotics because the infection is not caused by bacteria. However, you might catch a bacterial infection following the viral infection. This is sometimes  called a "superinfection." Symptoms of such a bacterial infection may include:  Worsening sore throat with pus and difficulty swallowing.  Swollen neck glands.  Chills and a high or persistent fever.  Severe headache.  Tenderness over the sinuses.  Persistent overall ill feeling (malaise), muscle aches, and tiredness (fatigue).  Persistent cough.  Yellow, green, or brown mucus production with coughing. HOME CARE INSTRUCTIONS   Only take over-the-counter or prescription medicines for pain, discomfort, diarrhea, or fever as directed by your caregiver.  Drink enough water and fluids to keep your urine clear or pale yellow. Sports drinks can provide valuable electrolytes, sugars, and hydration.  Get plenty of rest and maintain proper nutrition. Soups and broths with crackers or rice are fine. SEEK IMMEDIATE MEDICAL CARE IF:   You have severe headaches, shortness of breath, chest pain, neck pain, or an unusual rash.  You have uncontrolled vomiting, diarrhea, or you are unable to keep down fluids.  You or your child has an oral temperature above 102 F (38.9 C), not controlled by medicine.  Your baby is older than 3 months with a rectal temperature of 102 F (38.9 C) or higher.  Your baby is 34 months old or younger with a rectal temperature of 100.4 F (38 C) or higher. MAKE SURE YOU:   Understand these instructions.  Will watch your condition.  Will get help right away if you are not doing well or get worse.   This information is not intended to replace advice given to you by your health care provider. Make sure you discuss any questions you have with your health care provider.   Document Released: 10/13/2004 Document Revised: 03/28/2011 Document Reviewed: 06/11/2014 Elsevier Interactive Patient Education [employer].

## 2015-04-09 NOTE — ED Provider Notes (Signed)
CSN: [id number]     Arrival date & time 04/09/15  1859 History   First MD Initiated Contact with Patient 04/09/15 2033     Chief Complaint  Patient presents with  . Fever   HPI  Joy Pittman is a 35 year old female with PMHx of bipolar, PTSD and ADHD presenting with fever. Onset of symptoms was approximately 3 days ago. She is complaining of a generalized, pounding headache with associated sinus pressure between the eyes, nasal congestion and rhinorrhea. She denies purulent nasal discharge. She also complains of a cough productive of green tinged sputum. She states that her throat is not painful but feels slightly irritated.  She is also complaining of nausea and vomiting. She denies associated diarrhea or abdominal pain. She complains of fatigue and generalized myalgias as well. She has been taking ibuprofen for her fevers with appropriate reduction. She did not get a flu shot this year. She is unsure of sick contacts. She does report frequent UTIs. She denies current dysuria, hematuria or malodorous urine. Denies vaginal discharge or pelvic pain. She has no other complaints today.  Past Medical History  Diagnosis Date  . Bipolar 1 disorder ([place])   . PTSD (post-traumatic stress disorder)   . ADHD (attention deficit hyperactivity disorder)   . UTI (lower urinary tract infection)    Past Surgical History  Procedure Laterality Date  . Ectopic pregnancy surgery    . Tubal ligation    . Laparoscopy     Family History  Problem Relation Age of Onset  . Cancer Other   . Hypertension Other   . Diabetes Other   . Drug abuse Paternal Grandfather    Social History  Substance Use Topics  . Smoking status: Never Smoker   . Smokeless tobacco: Never Used  . Alcohol Use: No   OB History    Gravida Para Term Preterm AB TAB SAB Ectopic Multiple Living   4 3 3  1   1  3      Review of Systems  All other systems reviewed and are negative.     Allergies  Kiwi extract and Penicillins  Home  Medications   Prior to Admission medications   Medication Sig Start Date End Date Taking? Authorizing Provider  ADDERALL XR 20 MG 24 hr capsule Take 1 capsule by mouth 2 (two) times daily. 08/11/14  Yes Historical Provider, MD  FLUoxetine (PROZAC) 40 MG capsule Take 40 mg by mouth daily. 08/03/14  Yes Historical Provider, MD  LORazepam (ATIVAN) 1 MG tablet Take 1 mg by mouth every 8 (eight) hours as needed. For anxiety.   Yes Historical Provider, MD  Lurasidone HCl 60 MG TABS Take 60 mg by mouth every evening.    Yes Historical Provider, MD  propranolol (INDERAL) 20 MG tablet Take 20 mg by mouth at bedtime. Take with lurasidone for "restless legs" side effect.   Yes Historical Provider, MD  HYDROcodone-homatropine (HYCODAN) 5-1.5 MG/5ML syrup Take 5 mLs by mouth every 6 (six) hours as needed for cough. Patient not taking: Reported on 09/23/2014 09/04/14   Jarrett Soho Muthersbaugh, PA-C  ondansetron (ZOFRAN) 4 MG tablet Take 1 tablet (4 mg total) by mouth every 6 (six) hours. 04/09/15   Corydon Schweiss, PA-C  sulfamethoxazole-trimethoprim (BACTRIM DS,SEPTRA DS) 800-160 MG tablet Take 1 tablet by mouth 2 (two) times daily. 04/09/15 04/16/15  Lahoma Crocker Chalsey Leeth, PA-C  traMADol (ULTRAM) 50 MG tablet Take 1 tablet (50 mg total) by mouth every 6 (six) hours as needed. Patient not  taking: Reported on 06/13/2014 10/08/13   Janne Napoleon, NP   BP 94/62 mmHg  Pulse 78  Temp(Src) 99.7 F (37.6 C) (Oral)  Resp 18  SpO2 98%  LMP 03/28/2015 Physical Exam  Constitutional: She appears well-developed and well-nourished. No distress.  Nontoxic appearing  HENT:  Head: Normocephalic and atraumatic.  Nose: Right sinus exhibits frontal sinus tenderness. Right sinus exhibits no maxillary sinus tenderness. Left sinus exhibits frontal sinus tenderness. Left sinus exhibits no maxillary sinus tenderness.  Mouth/Throat: Uvula is midline and mucous membranes are normal. No uvula swelling. Posterior oropharyngeal erythema present. No  oropharyngeal exudate or posterior oropharyngeal edema.  Eyes: Conjunctivae are normal. Right eye exhibits no discharge. Left eye exhibits no discharge. No scleral icterus.  Neck: Normal range of motion. Neck supple.  Cardiovascular: Normal rate, regular rhythm and normal heart sounds.   Pulmonary/Chest: Effort normal and breath sounds normal. No respiratory distress. She has no wheezes. She has no rales.  Abdominal: Soft. She exhibits no distension. There is no tenderness.  Musculoskeletal: Normal range of motion.  Lymphadenopathy:    She has no cervical adenopathy.  Neurological: She is alert. Coordination normal.  Skin: Skin is warm and dry.  Psychiatric: She has a normal mood and affect. Her behavior is normal.  Nursing note and vitals reviewed.   ED Course  Procedures (including critical care time) Labs Review Labs Reviewed  CBC WITH DIFFERENTIAL/PLATELET - Abnormal; Notable for the following:    WBC 3.9 (*)    Hemoglobin 11.0 (*)    HCT 34.1 (*)    RDW 15.8 (*)    Lymphs Abs 0.6 (*)    All other components within normal limits  BASIC METABOLIC PANEL - Abnormal; Notable for the following:    Glucose, Bld 105 (*)    Calcium 8.8 (*)    All other components within normal limits  URINALYSIS, ROUTINE W REFLEX MICROSCOPIC (NOT AT [facility]) - Abnormal; Notable for the following:    APPearance CLOUDY (*)    Hgb urine dipstick MODERATE (*)    Leukocytes, UA LARGE (*)    All other components within normal limits  URINE MICROSCOPIC-ADD ON - Abnormal; Notable for the following:    Squamous Epithelial / LPF 6-30 (*)    Bacteria, UA MANY (*)    All other components within normal limits  RAPID STREP SCREEN (NOT AT [facility])  CULTURE, GROUP A STREP [facility])    Imaging Review Dg Chest 2 View  04/09/2015  CLINICAL DATA:  Productive cough with shortness of breath and mid chest pain for 3 days. EXAM: CHEST  2 VIEW COMPARISON:  Chest x-ray dated 09/04/2014. FINDINGS: Cardiomediastinal silhouette  is normal in size and configuration. Lungs are clear. Lung volumes are normal. No evidence of pneumonia. No pleural effusion. No pneumothorax. Osseous and soft tissue structures about the chest are unremarkable. IMPRESSION: No active cardiopulmonary disease. Electronically Signed   By: Franki Cabot M.D.   On: 04/09/2015 20:12   I have personally reviewed and evaluated these images and lab results as part of my medical decision-making.   EKG Interpretation None      MDM   Final diagnoses:  Flu-like symptoms  UTI (lower urinary tract infection)   35 year old female presenting with fever, myalgias, sinus pressure, nasal congestion, cough, nausea and vomiting 3 days. Febrile to 103 in triage which reduced to 98.7 after Tylenol. Patient is nontoxic-appearing and hemodynamically stable. No nasal congestion noted; bilateral frontal sinus tenderness to palpation without overlying erythema. Posterior oropharyngeal  erythema without exudate. No cervical adenopathy. Lungs clear to auscultation bilaterally. Abdomen is soft, nontender without peritoneal signs. No elevated white blood cell count. Hemoglobin is at her baseline. Negative rapid strep. Urinalysis positive for UTI. Chest x-ray negative. Patient presentation consistent with viral respiratory illness. Will discharge home with symptomatic care. Will also treat UTI wit bactrim. Return precautions given in discharge paperwork and discussed with pt at bedside. Pt stable for discharge     Josephina Gip, PA-C 04/10/15 0000  Sharlett Iles, MD 04/11/15 [phone]

## 2015-04-12 LAB — CULTURE, GROUP A STREP (THRC)

## 2015-04-17 ENCOUNTER — Ambulatory Visit (INDEPENDENT_AMBULATORY_CARE_PROVIDER_SITE_OTHER): Payer: Medicaid Other | Admitting: Certified Nurse Midwife

## 2015-04-17 ENCOUNTER — Encounter: Payer: Self-pay | Admitting: Certified Nurse Midwife

## 2015-04-17 VITALS — BP 114/81 | HR 72 | Wt 204.0 lb

## 2015-04-17 DIAGNOSIS — B3731 Acute candidiasis of vulva and vagina: Secondary | ICD-10-CM

## 2015-04-17 DIAGNOSIS — B373 Candidiasis of vulva and vagina: Secondary | ICD-10-CM

## 2015-04-17 MED ORDER — FLUCONAZOLE 100 MG PO TABS: 200.0000 mg | ORAL_TABLET | Freq: Once | ORAL | Status: DC

## 2015-04-17 MED ORDER — FLUCONAZOLE 200 MG PO TABS: 200.0000 mg | ORAL_TABLET | Freq: Once | ORAL | Status: DC

## 2015-04-17 MED ORDER — TERCONAZOLE 0.4 % VA CREA: 1.0000 | TOPICAL_CREAM | Freq: Every day | VAGINAL | Status: DC

## 2015-04-17 NOTE — Addendum Note (Signed)
Addended by: Lewie Loron D on: 04/17/2015 04:35 PM   Modules accepted: Orders

## 2015-04-17 NOTE — Progress Notes (Signed)
Patient ID: Joy Pittman, female   DOB: [birth year], 35 y.o.   MRN: [id number]  Chief Complaint  Patient presents with  . Vaginitis    vaginal discharge, mild irritation--was recently tx for UTI    HPI Joy Pittman is a 35 y.o. female.  States she has an irritation that is getting worse. Has had vulvar itching for a few days.  Recently was treated for a UTI.  Due for annual exam in May.  Otherwise is doing well.  Denies any new partners.   HPI  Past Medical History  Diagnosis Date  . Bipolar 1 disorder ([place])   . PTSD (post-traumatic stress disorder)   . ADHD (attention deficit hyperactivity disorder)   . UTI (lower urinary tract infection)     Past Surgical History  Procedure Laterality Date  . Ectopic pregnancy surgery    . Tubal ligation    . Laparoscopy      Family History  Problem Relation Age of Onset  . Cancer Other   . Hypertension Other   . Diabetes Other   . Drug abuse Paternal Grandfather     Social History Social History  Substance Use Topics  . Smoking status: Never Smoker   . Smokeless tobacco: Never Used  . Alcohol Use: No   Allergies: kiwi extract and Penicillins    Current Outpatient Prescriptions  Medication Sig Dispense Refill  . ADDERALL XR 20 MG 24 hr capsule Take 1 capsule by mouth 2 (two) times daily.  0  . FLUoxetine (PROZAC) 40 MG capsule Take 40 mg by mouth daily.  1  . LORazepam (ATIVAN) 1 MG tablet Take 1 mg by mouth every 8 (eight) hours as needed. For anxiety.    . Lurasidone HCl 60 MG TABS Take 60 mg by mouth every evening.     . propranolol (INDERAL) 20 MG tablet Take 20 mg by mouth at bedtime. Take with lurasidone for "restless legs" side effect.    . fluconazole (DIFLUCAN) 200 MG tablet Take 1 tablet (200 mg total) by mouth once. Repeat in 48-72 hours. 3 tablet 0  . ondansetron (ZOFRAN) 4 MG tablet Take 1 tablet (4 mg total) by mouth every 6 (six) hours. (Patient not taking: Reported on 04/17/2015) 12 tablet 0  . terconazole  (TERAZOL 7) 0.4 % vaginal cream Place 1 applicator vaginally at bedtime. 45 g 0   No current facility-administered medications for this visit.    Review of Systems Review of Systems Constitutional: negative for fatigue and weight loss Respiratory: negative for cough and wheezing Cardiovascular: negative for chest pain, fatigue and palpitations Gastrointestinal: negative for abdominal pain and change in bowel habits Genitourinary:+ vaginal itching/discharge Integument/breast: negative for nipple discharge Musculoskeletal:negative for myalgias Neurological: negative for gait problems and tremors Behavioral/Psych: negative for abusive relationship, depression Endocrine: negative for temperature intolerance     Blood pressure 114/81, pulse 72, weight 204 lb (92.534 kg), last menstrual period 03/28/2015.  Physical Exam Physical Exam General:   alert  Skin:   no rash or abnormalities  Lungs:   clear to auscultation bilaterally  Heart:   regular rate and rhythm, S1, S2 normal, no murmur, click, rub or gallop  Breasts:   deferred  Abdomen:  normal findings: no organomegaly, soft, non-tender and no hernia  Pelvis:  External genitalia: normal general appearance Urinary system: urethral meatus normal and bladder without fullness, nontender Vaginal: normal without tenderness, induration or masses, + white chunky vaginal discharge Cervix: normal appearance Adnexa: normal bimanual exam Uterus:  anteverted and non-tender, normal size    50% of 15 min visit spent on counseling and coordination of care.   Data Reviewed Previous medical hx, meds  Assessment     Vulvovaginal candidiasis     Plan    No orders of the defined types were placed in this encounter.   Meds ordered this encounter  Medications  . DISCONTD: fluconazole (DIFLUCAN) 100 MG tablet    Sig: Take 2 tablets (200 mg total) by mouth once. Repeat dose in 48-72 hour.    Dispense:  3 tablet    Refill:  0  . terconazole  (TERAZOL 7) 0.4 % vaginal cream    Sig: Place 1 applicator vaginally at bedtime.    Dispense:  45 g    Refill:  0  . fluconazole (DIFLUCAN) 200 MG tablet    Sig: Take 1 tablet (200 mg total) by mouth once. Repeat in 48-72 hours.    Dispense:  3 tablet    Refill:  0    Follow up with annual exam in May.

## 2015-04-22 ENCOUNTER — Other Ambulatory Visit: Payer: Self-pay | Admitting: Certified Nurse Midwife

## 2015-04-22 DIAGNOSIS — A5909 Other urogenital trichomoniasis: Secondary | ICD-10-CM

## 2015-04-22 LAB — NUSWAB VAGINITIS PLUS (VG+)
Candida albicans, NAA: NEGATIVE
Candida glabrata, NAA: NEGATIVE
Chlamydia trachomatis, NAA: NEGATIVE
Neisseria gonorrhoeae, NAA: NEGATIVE
Trich vag by NAA: POSITIVE — AB

## 2015-04-22 MED ORDER — METRONIDAZOLE 500 MG PO TABS: 2000.0000 mg | ORAL_TABLET | Freq: Once | ORAL | Status: DC

## 2015-05-29 ENCOUNTER — Ambulatory Visit: Payer: Medicaid Other | Admitting: Certified Nurse Midwife

## 2015-06-02 ENCOUNTER — Ambulatory Visit: Payer: Medicaid Other | Admitting: Certified Nurse Midwife

## 2015-06-16 ENCOUNTER — Ambulatory Visit (INDEPENDENT_AMBULATORY_CARE_PROVIDER_SITE_OTHER): Payer: Medicaid Other | Admitting: Certified Nurse Midwife

## 2015-06-16 ENCOUNTER — Other Ambulatory Visit: Payer: Self-pay | Admitting: Certified Nurse Midwife

## 2015-06-16 VITALS — BP 106/76 | HR 65 | Ht 65.0 in | Wt 205.0 lb

## 2015-06-16 DIAGNOSIS — Z113 Encounter for screening for infections with a predominantly sexual mode of transmission: Secondary | ICD-10-CM

## 2015-06-16 DIAGNOSIS — Z01419 Encounter for gynecological examination (general) (routine) without abnormal findings: Secondary | ICD-10-CM

## 2015-06-16 DIAGNOSIS — Z0000 Encounter for general adult medical examination without abnormal findings: Secondary | ICD-10-CM

## 2015-06-16 DIAGNOSIS — N946 Dysmenorrhea, unspecified: Secondary | ICD-10-CM

## 2015-06-16 MED ORDER — TRAMADOL HCL 50 MG PO TABS
50.0000 mg | ORAL_TABLET | Freq: Four times a day (QID) | ORAL | Status: DC | PRN
Start: 2015-06-16 — End: 2015-10-28

## 2015-06-16 MED ORDER — IBUPROFEN 800 MG PO TABS: 800.0000 mg | ORAL_TABLET | Freq: Three times a day (TID) | ORAL | Status: DC | PRN

## 2015-06-16 NOTE — Progress Notes (Signed)
Subjective:        Joy Pittman is a 35 y.o. female here for a routine exam.  Current complaints: dysmenorrhea.    Periods lasting 5 days, no heavy bleeding.  Cramping with cycle, feels like contractions.  Patient has pondered having a partial hysterectomy.  Reports years of cramping pain.  Open to IUD to help with cramping, before pursuing surgical intervention.    This visit: order F/U US to check on past fibroid.  Personal health questionnaire:  Is patient Ashkenazi Jewish, have a family history of breast and/or ovarian cancer: no Is there a family history of uterine cancer diagnosed at age < 51, gastrointestinal cancer, urinary tract cancer, family member who is a Field seismologist syndrome-associated carrier: no Is the patient overweight and hypertensive, family history of diabetes, personal history of gestational diabetes, preeclampsia or PCOS: NO Is patient over 45, have PCOS,  family history of premature CHD under age 56, diabetes, smoke, have hypertension or peripheral artery disease:  no At any time, has a partner hit, kicked or otherwise hurt or frightened you?: no Over the past 2 weeks, have you felt down, depressed or hopeless?: no Over the past 2 weeks, have you felt little interest or pleasure in doing things?:no   Gynecologic History Patient's last menstrual period was 05/30/2015. Contraception: tubal ligation Last Pap: 06-13-2014. Results were: normal Last mammogram: N/A. Results were: N/A  Obstetric History OB History  Gravida Para Term Preterm AB SAB TAB Ectopic Multiple Living  4 3 3  1   1  3     # Outcome Date GA Lbr Len/2nd Weight Sex Delivery Anes PTL Lv  4 Term 07/29/00 [redacted]w[redacted]d   F Vag-Spont     3 Term 11/27/98 [redacted]w[redacted]d   M Vag-Spont     2 Term 01/06/96 [redacted]w[redacted]d   M Vag-Spont     1 Ectopic               Past Medical History  Diagnosis Date  . Bipolar 1 disorder ([place])   . PTSD (post-traumatic stress disorder)   . ADHD (attention deficit hyperactivity disorder)   .  UTI (lower urinary tract infection)     Past Surgical History  Procedure Laterality Date  . Ectopic pregnancy surgery    . Tubal ligation    . Laparoscopy       Current outpatient prescriptions:  .  ADDERALL XR 20 MG 24 hr capsule, Take 1 capsule by mouth 2 (two) times daily., Disp: , Rfl: 0 .  FLUoxetine (PROZAC) 40 MG capsule, Take 40 mg by mouth daily., Disp: , Rfl: 1 .  LORazepam (ATIVAN) 1 MG tablet, Take 1 mg by mouth every 8 (eight) hours as needed. For anxiety., Disp: , Rfl:  .  Lurasidone HCl 60 MG TABS, Take 60 mg by mouth every evening. , Disp: , Rfl:  .  ibuprofen (ADVIL,MOTRIN) 800 MG tablet, Take 1 tablet (800 mg total) by mouth every 8 (eight) hours as needed., Disp: 60 tablet, Rfl: 1 .  ondansetron (ZOFRAN) 4 MG tablet, Take 1 tablet (4 mg total) by mouth every 6 (six) hours. (Patient not taking: Reported on 04/17/2015), Disp: 12 tablet, Rfl: 0 .  propranolol (INDERAL) 20 MG tablet, Take 20 mg by mouth at bedtime. Reported on 06/16/2015, Disp: , Rfl:  .  traMADol (ULTRAM) 50 MG tablet, Take 1 tablet (50 mg total) by mouth every 6 (six) hours as needed for moderate pain., Disp: 60 tablet, Rfl: 2 Allergies  Allergen Reactions  .  Kiwi Extract Itching and Swelling  . Penicillins Hives, Itching and Swelling    Tolerates amoxicillin, but states it will still make her itch.   Has patient had a PCN reaction causing immediate rash, facial/tongue/throat swelling, SOB or lightheadedness with hypotension: Yes Has patient had a PCN reaction causing severe rash involving mucus membranes or skin necrosis: No Has patient had a PCN reaction that required hospitalization No Has patient had a PCN reaction occurring within the last 10 years: Yes If all of the above answers are "NO", then may proceed with Cephal    Social History  Substance Use Topics  . Smoking status: Never Smoker   . Smokeless tobacco: Never Used  . Alcohol Use: No    Family History  Problem Relation Age of Onset  .  Cancer Other   . Hypertension Other   . Diabetes Other   . Drug abuse Paternal Grandfather       Review of Systems  Constitutional: negative for fatigue and weight loss Respiratory: negative for cough and wheezing Cardiovascular: negative for chest pain, fatigue and palpitations Gastrointestinal: negative for abdominal pain and change in bowel habits Musculoskeletal:negative for myalgias Neurological: negative for gait problems and tremors Behavioral/Psych: negative for abusive relationship, depression Endocrine: negative for temperature intolerance   Genitourinary:negative for genital lesions, hot flashes, sexual problems and vaginal discharge.  + for dysmenorrhea. Integument/breast: negative for breast lump, breast tenderness, nipple discharge and skin lesion(s)    Objective:       BP 106/76 mmHg  Pulse 65  Ht 5\' 5"  (1.651 m)  Wt 205 lb (92.987 kg)  BMI 34.11 kg/m2  LMP 05/30/2015 General:   alert  Skin:   no rash or abnormalities  Lungs:   clear to auscultation bilaterally  Heart:   regular rate and rhythm, S1, S2 normal, no murmur, click, rub or gallop  Breasts:   normal without suspicious masses, skin or nipple changes or axillary nodes  Abdomen:  normal findings: no organomegaly, soft, non-tender and no hernia  Pelvis:  External genitalia: normal general appearance Urinary system: urethral meatus normal and bladder without fullness, nontender Vaginal: normal without tenderness, induration or masses Cervix: normal appearance Adnexa: normal bimanual exam Uterus: anteverted and non-tender, normal size   Lab Review Urine pregnancy test Labs reviewed yes Radiologic studies reviewed no  50% of 30 min visit spent on counseling and coordination of care.   Assessment:    Healthy female exam.    Pelvic US to check on past fibroid. F/U visit for Mirena IUD insertion, for dysmenorrhea.  Mammogram in 5 years.   Plan:    Education reviewed: depression evaluation,  safe sex/STD prevention, self breast exams and weight bearing exercise. Contraception: tubal ligation. Follow up in: 1 month after pelvic US.   Meds ordered this encounter  Medications  . traMADol (ULTRAM) 50 MG tablet    Sig: Take 1 tablet (50 mg total) by mouth every 6 (six) hours as needed for moderate pain.    Dispense:  60 tablet    Refill:  2  . ibuprofen (ADVIL,MOTRIN) 800 MG tablet    Sig: Take 1 tablet (800 mg total) by mouth every 8 (eight) hours as needed.    Dispense:  60 tablet    Refill:  1   Orders Placed This Encounter  Procedures  . US Transvaginal Non-OB    Standing Status: Future     Number of Occurrences:      Standing Expiration Date: 08/15/2016  Order Specific Question:  Reason for Exam (SYMPTOM  OR DIAGNOSIS REQUIRED)    Answer:  dysmenorrhea    Order Specific Question:  Preferred imaging location?    Answer:  [facility]  . US Pelvis Complete    Standing Status: Future     Number of Occurrences:      Standing Expiration Date: 08/15/2016    Order Specific Question:  Reason for Exam (SYMPTOM  OR DIAGNOSIS REQUIRED)    Answer:  dysmenorrhea    Order Specific Question:  Preferred imaging location?    Answer:  [facility]  . Hepatitis B surface antigen  . RPR  . Hepatitis C antibody  . HIV antibody  . Ambulatory referral to Internal Medicine    Referral Priority:  Routine    Referral Type:  Consultation    Requested Specialty:  Internal Medicine    Number of Visits Requested:  1   Need to obtain previous records Possible management options include: consider IUD insertion for management of dysmenorrhea. Schedule follow up for Mirena IUD insertion. Follow up as needed.

## 2015-06-17 ENCOUNTER — Encounter: Payer: Self-pay | Admitting: Certified Nurse Midwife

## 2015-06-17 DIAGNOSIS — N946 Dysmenorrhea, unspecified: Secondary | ICD-10-CM | POA: Insufficient documentation

## 2015-06-17 LAB — HEPATITIS B SURFACE ANTIGEN: Hepatitis B Surface Ag: NEGATIVE

## 2015-06-17 LAB — HEPATITIS C ANTIBODY: Hep C Virus Ab: <0.1 s/co ratio (ref 0.0–0.9)

## 2015-06-17 LAB — HIV ANTIBODY (ROUTINE TESTING W REFLEX): HIV SCREEN 4TH GENERATION: NONREACTIVE

## 2015-06-17 LAB — RPR: RPR: NONREACTIVE

## 2015-06-17 NOTE — Progress Notes (Signed)
Patient ID: Joy Pittman, female   DOB: Jul 15, 1980, 35 y.o.   MRN: [id number]  I was present and agree with the NP Student A. Brake's note for [name].   R.Aeliana Spates CNM

## 2015-06-18 LAB — PAP IG AND HPV HIGH-RISK
HPV, high-risk: NEGATIVE
PAP Smear Comment: 0

## 2015-06-22 LAB — NUSWAB VG+, CANDIDA 6SP
Atopobium vaginae: HIGH Score — AB
BVAB 2: HIGH {score} — AB
CANDIDA PARAPSILOSIS, NAA: NEGATIVE
CHLAMYDIA TRACHOMATIS, NAA: NEGATIVE
Candida albicans, NAA: POSITIVE — AB
Candida glabrata, NAA: NEGATIVE
Candida krusei, NAA: NEGATIVE
Candida lusitaniae, NAA: NEGATIVE
Candida tropicalis, NAA: NEGATIVE
MEGASPHAERA 1: HIGH {score} — AB
Neisseria gonorrhoeae, NAA: NEGATIVE
TRICH VAG BY NAA: NEGATIVE

## 2015-06-23 ENCOUNTER — Other Ambulatory Visit: Payer: Self-pay | Admitting: Certified Nurse Midwife

## 2015-06-23 ENCOUNTER — Ambulatory Visit (HOSPITAL_COMMUNITY)
Admission: RE | Admit: 2015-06-23 | Discharge: 2015-06-23 | Disposition: A | Discharge status: Home / Self Care | Payer: Medicaid Other | Source: Ambulatory Visit | Attending: Certified Nurse Midwife | Admitting: Certified Nurse Midwife

## 2015-06-23 DIAGNOSIS — Z113 Encounter for screening for infections with a predominantly sexual mode of transmission: Secondary | ICD-10-CM | POA: Diagnosis present

## 2015-06-23 DIAGNOSIS — N760 Acute vaginitis: Secondary | ICD-10-CM

## 2015-06-23 DIAGNOSIS — N946 Dysmenorrhea, unspecified: Secondary | ICD-10-CM | POA: Insufficient documentation

## 2015-06-23 DIAGNOSIS — D252 Subserosal leiomyoma of uterus: Secondary | ICD-10-CM | POA: Diagnosis not present

## 2015-06-23 DIAGNOSIS — B373 Candidiasis of vulva and vagina: Secondary | ICD-10-CM

## 2015-06-23 DIAGNOSIS — B3731 Acute candidiasis of vulva and vagina: Secondary | ICD-10-CM

## 2015-06-23 DIAGNOSIS — Z01419 Encounter for gynecological examination (general) (routine) without abnormal findings: Secondary | ICD-10-CM

## 2015-06-23 DIAGNOSIS — B9689 Other specified bacterial agents as the cause of diseases classified elsewhere: Secondary | ICD-10-CM

## 2015-06-23 MED ORDER — METRONIDAZOLE 500 MG PO TABS: 500.0000 mg | ORAL_TABLET | Freq: Two times a day (BID) | ORAL | Status: DC

## 2015-06-23 MED ORDER — TERCONAZOLE 0.8 % VA CREA: 1.0000 | TOPICAL_CREAM | Freq: Every day | VAGINAL | Status: DC

## 2015-06-23 MED ORDER — FLUCONAZOLE 200 MG PO TABS: 200.0000 mg | ORAL_TABLET | Freq: Once | ORAL | Status: DC

## 2015-06-24 ENCOUNTER — Ambulatory Visit: Payer: Medicaid Other | Admitting: Certified Nurse Midwife

## 2015-06-24 ENCOUNTER — Encounter: Payer: Self-pay | Admitting: *Deleted

## 2015-06-25 ENCOUNTER — Ambulatory Visit: Payer: Medicaid Other | Admitting: Certified Nurse Midwife

## 2015-06-25 ENCOUNTER — Other Ambulatory Visit: Payer: Self-pay | Admitting: Certified Nurse Midwife

## 2015-06-25 ENCOUNTER — Telehealth: Payer: Self-pay | Admitting: *Deleted

## 2015-06-25 NOTE — Telephone Encounter (Signed)
She does not meet the criteria for a hysterectomy.  IE: she needs to fail several methods of contraception/interventions at this age to qualify for a hysterectomy.  She needs to have the IUD.  Her PUS demonstrated one small uterine fibroid.  Thank you.  R.Iva Posten CNM

## 2015-06-25 NOTE — Telephone Encounter (Signed)
Patient has decided that she wants to have the hysterectomy and not use the IUD. Could you please call her to discuss her surgery- referral.

## 2015-07-01 NOTE — Telephone Encounter (Signed)
LM on VM-re: response from provider- she is the consumer and she can get another opion-but if she decides to try the IUD or another contraception.

## 2015-07-10 ENCOUNTER — Emergency Department (HOSPITAL_COMMUNITY)
Admission: EM | Admit: 2015-07-10 | Discharge: 2015-07-10 | Disposition: A | Discharge status: Home / Self Care | Payer: Medicaid Other | Attending: Emergency Medicine | Admitting: Emergency Medicine

## 2015-07-10 ENCOUNTER — Encounter (HOSPITAL_COMMUNITY): Payer: Self-pay | Admitting: Emergency Medicine

## 2015-07-10 DIAGNOSIS — Z79899 Other long term (current) drug therapy: Secondary | ICD-10-CM | POA: Insufficient documentation

## 2015-07-10 DIAGNOSIS — F319 Bipolar disorder, unspecified: Secondary | ICD-10-CM | POA: Diagnosis not present

## 2015-07-10 DIAGNOSIS — Z791 Long term (current) use of non-steroidal anti-inflammatories (NSAID): Secondary | ICD-10-CM | POA: Insufficient documentation

## 2015-07-10 DIAGNOSIS — R51 Headache: Secondary | ICD-10-CM | POA: Diagnosis present

## 2015-07-10 DIAGNOSIS — F909 Attention-deficit hyperactivity disorder, unspecified type: Secondary | ICD-10-CM | POA: Diagnosis not present

## 2015-07-10 DIAGNOSIS — G43809 Other migraine, not intractable, without status migrainosus: Secondary | ICD-10-CM | POA: Diagnosis not present

## 2015-07-10 DIAGNOSIS — F4310 Post-traumatic stress disorder, unspecified: Secondary | ICD-10-CM | POA: Insufficient documentation

## 2015-07-10 MED ORDER — SODIUM CHLORIDE 0.9 % IV BOLUS (SEPSIS): 1000.0000 mL | Freq: Once | INTRAVENOUS | Status: DC

## 2015-07-10 MED ORDER — DIPHENHYDRAMINE HCL 25 MG PO CAPS: 25.0000 mg | ORAL_CAPSULE | Freq: Four times a day (QID) | ORAL | Status: DC | PRN

## 2015-07-10 MED ORDER — VALPROATE SODIUM 500 MG/5ML IV SOLN
500.0000 mg | Freq: Once | INTRAVENOUS | Status: AC
  Administered 2015-07-10: 500 mg via INTRAVENOUS
  Filled 2015-07-10: qty 5

## 2015-07-10 MED ORDER — ALBUTEROL SULFATE (2.5 MG/3ML) 0.083% IN NEBU: 2.5000 mg | INHALATION_SOLUTION | Freq: Once | RESPIRATORY_TRACT | Status: DC

## 2015-07-10 MED ORDER — NAPROXEN 500 MG PO TABS: 500.0000 mg | ORAL_TABLET | Freq: Two times a day (BID) | ORAL | Status: DC

## 2015-07-10 MED ORDER — DEXAMETHASONE SODIUM PHOSPHATE 10 MG/ML IJ SOLN
10.0000 mg | Freq: Once | INTRAMUSCULAR | Status: AC
  Administered 2015-07-10: 10 mg via INTRAVENOUS
  Filled 2015-07-10: qty 1

## 2015-07-10 MED ORDER — METOCLOPRAMIDE HCL 10 MG PO TABS: 10.0000 mg | ORAL_TABLET | Freq: Four times a day (QID) | ORAL | Status: DC | PRN

## 2015-07-10 MED ORDER — DIPHENHYDRAMINE HCL 50 MG/ML IJ SOLN
25.0000 mg | Freq: Once | INTRAMUSCULAR | Status: AC
  Administered 2015-07-10: 25 mg via INTRAVENOUS
  Filled 2015-07-10: qty 1

## 2015-07-10 MED ORDER — SODIUM CHLORIDE 0.9 % IV BOLUS (SEPSIS)
1000.0000 mL | Freq: Once | INTRAVENOUS | Status: AC
  Administered 2015-07-10: 1000 mL via INTRAVENOUS

## 2015-07-10 MED ORDER — METOCLOPRAMIDE HCL 5 MG/ML IJ SOLN
10.0000 mg | Freq: Once | INTRAMUSCULAR | Status: AC
  Administered 2015-07-10: 10 mg via INTRAVENOUS
  Filled 2015-07-10: qty 2

## 2015-07-10 MED ORDER — KETOROLAC TROMETHAMINE 30 MG/ML IJ SOLN
30.0000 mg | Freq: Once | INTRAMUSCULAR | Status: AC
  Administered 2015-07-10: 30 mg via INTRAVENOUS
  Filled 2015-07-10: qty 1

## 2015-07-10 NOTE — Discharge Instructions (Signed)
Reglan, Benadryl, naproxen by mouth as needed for recurrent headache.  Migraine Headache A migraine headache is an intense, throbbing pain on one or both sides of your head. A migraine can last for 30 minutes to several hours. CAUSES  The exact cause of a migraine headache is not always known. However, a migraine may be caused when nerves in the brain become irritated and release chemicals that cause inflammation. This causes pain. Certain things may also trigger migraines, such as:  Alcohol.  Smoking.  Stress.  Menstruation.  Aged cheeses.  Foods or drinks that contain nitrates, glutamate, aspartame, or tyramine.  Lack of sleep.  Chocolate.  Caffeine.  Hunger.  Physical exertion.  Fatigue.  Medicines used to treat chest pain (nitroglycerine), birth control pills, estrogen, and some blood pressure medicines. SIGNS AND SYMPTOMS  Pain on one or both sides of your head.  Pulsating or throbbing pain.  Severe pain that prevents daily activities.  Pain that is aggravated by any physical activity.  Nausea, vomiting, or both.  Dizziness.  Pain with exposure to bright lights, loud noises, or activity.  General sensitivity to bright lights, loud noises, or smells. Before you get a migraine, you may get warning signs that a migraine is coming (aura). An aura may include:  Seeing flashing lights.  Seeing bright spots, halos, or zigzag lines.  Having tunnel vision or blurred vision.  Having feelings of numbness or tingling.  Having trouble talking.  Having muscle weakness. DIAGNOSIS  A migraine headache is often diagnosed based on:  Symptoms.  Physical exam.  A CT scan or MRI of your head. These imaging tests cannot diagnose migraines, but they can help rule out other causes of headaches. TREATMENT Medicines may be given for pain and nausea. Medicines can also be given to help prevent recurrent migraines.  HOME CARE INSTRUCTIONS  Only take  over-the-counter or prescription medicines for pain or discomfort as directed by your health care provider. The use of long-term narcotics is not recommended.  Lie down in a dark, quiet room when you have a migraine.  Keep a journal to find out what may trigger your migraine headaches. For example, write down:  What you eat and drink.  How much sleep you get.  Any change to your diet or medicines.  Limit alcohol consumption.  Quit smoking if you smoke.  Get 7-9 hours of sleep, or as recommended by your health care provider.  Limit stress.  Keep lights dim if bright lights bother you and make your migraines worse. SEEK IMMEDIATE MEDICAL CARE IF:   Your migraine becomes severe.  You have a fever.  You have a stiff neck.  You have vision loss.  You have muscular weakness or loss of muscle control.  You start losing your balance or have trouble walking.  You feel faint or pass out.  You have severe symptoms that are different from your first symptoms. MAKE SURE YOU:   Understand these instructions.  Will watch your condition.  Will get help right away if you are not doing well or get worse.   This information is not intended to replace advice given to you by your health care provider. Make sure you discuss any questions you have with your health care provider.   Document Released: 01/03/2005 Document Revised: 01/24/2014 Document Reviewed: 09/10/2012 Elsevier Interactive Patient Education [employer].

## 2015-07-10 NOTE — ED Notes (Signed)
MD at bedside. 

## 2015-07-10 NOTE — ED Notes (Signed)
Discharge instructions, follow up care, and rx x3 reviewed with patient. Patient verbalized understanding. 

## 2015-07-10 NOTE — ED Provider Notes (Signed)
CSN: [id number]     Arrival date & time 07/10/15  0900 History   First MD Initiated Contact with Patient 07/10/15 1004     Chief Complaint  Patient presents with  . Headache      HPI  Patient presents for evaluation of a headache. States she has a history of "migraines". Does not see a physician about them. Specifically take over-the-counter medicines without relief. Typical headache is frontal and throbbing. This headache started 2 days ago. Was slow in onset but progressive. Not a thunderclap event. Still frontal and throbbing. States she's never been nauseated with headache before and she had vomiting for the last 24 hours and presents here. No fevers no neck pain or symptoms. No neurological symptoms. Photophobic. No other vision changes.  Past Medical History  Diagnosis Date  . Bipolar 1 disorder ([place])   . PTSD (post-traumatic stress disorder)   . ADHD (attention deficit hyperactivity disorder)   . UTI (lower urinary tract infection)    Past Surgical History  Procedure Laterality Date  . Ectopic pregnancy surgery    . Tubal ligation    . Laparoscopy     Family History  Problem Relation Age of Onset  . Cancer Other   . Hypertension Other   . Diabetes Other   . Drug abuse Paternal Grandfather    Social History  Substance Use Topics  . Smoking status: Never Smoker   . Smokeless tobacco: Never Used  . Alcohol Use: No   OB History    Gravida Para Term Preterm AB TAB SAB Ectopic Multiple Living   4 3 3  1   1  3      Review of Systems  Constitutional: Negative for fever, chills, diaphoresis, appetite change and fatigue.  HENT: Negative for mouth sores, sore throat and trouble swallowing.   Eyes: Positive for photophobia. Negative for visual disturbance.  Respiratory: Negative for cough, chest tightness, shortness of breath and wheezing.   Cardiovascular: Negative for chest pain.  Gastrointestinal: Positive for nausea and vomiting. Negative for abdominal pain, diarrhea  and abdominal distention.  Endocrine: Negative for polydipsia, polyphagia and polyuria.  Genitourinary: Negative for dysuria, frequency and hematuria.  Musculoskeletal: Negative for gait problem.  Skin: Negative for color change, pallor and rash.  Neurological: Positive for headaches. Negative for dizziness, syncope and light-headedness.  Hematological: Does not bruise/bleed easily.  Psychiatric/Behavioral: Negative for behavioral problems and confusion.      Allergies  Kiwi extract and Penicillins  Home Medications   Prior to Admission medications   Medication Sig Start Date End Date Taking? Authorizing Provider  ADDERALL XR 20 MG 24 hr capsule Take 1 capsule by mouth 2 (two) times daily. 08/11/14  Yes Historical Provider, MD  FLUoxetine (PROZAC) 40 MG capsule Take 40 mg by mouth daily. 08/03/14  Yes Historical Provider, MD  ibuprofen (ADVIL,MOTRIN) 800 MG tablet Take 1 tablet (800 mg total) by mouth every 8 (eight) hours as needed. Patient taking differently: Take 800 mg by mouth every 8 (eight) hours as needed for headache or moderate pain.  06/16/15  Yes Rachelle A Denney, CNM  LORazepam (ATIVAN) 1 MG tablet Take 1 mg by mouth every 8 (eight) hours as needed. For anxiety.   Yes Historical Provider, MD  Lurasidone HCl 60 MG TABS Take 60 mg by mouth every evening.    Yes Historical Provider, MD  propranolol (INDERAL) 20 MG tablet Take 20 mg by mouth at bedtime. Reported on 06/16/2015   Yes Historical Provider, MD  terconazole (TERAZOL 3) 0.8 % vaginal cream Place 1 applicator vaginally at bedtime. 06/23/15  Yes Rachelle A Denney, CNM  traMADol (ULTRAM) 50 MG tablet Take 1 tablet (50 mg total) by mouth every 6 (six) hours as needed for moderate pain. 06/16/15  Yes Rachelle A Denney, CNM  diphenhydrAMINE (BENADRYL) 25 mg capsule Take 1 capsule (25 mg total) by mouth every 6 (six) hours as needed. 07/10/15   Tanna Furry, MD  fluconazole (DIFLUCAN) 200 MG tablet Take 1 tablet (200 mg total) by  mouth once. Repeat dose in 48-72 hours. Patient not taking: Reported on 07/10/2015 06/23/15   Morene Crocker, CNM  metoCLOPramide (REGLAN) 10 MG tablet Take 1 tablet (10 mg total) by mouth every 6 (six) hours as needed for nausea (Headache. take with benadryl and Naprosyn). 07/10/15   Tanna Furry, MD  metroNIDAZOLE (FLAGYL) 500 MG tablet Take 1 tablet (500 mg total) by mouth 2 (two) times daily. Patient not taking: Reported on 07/10/2015 06/23/15   Morene Crocker, CNM  naproxen (NAPROSYN) 500 MG tablet Take 1 tablet (500 mg total) by mouth 2 (two) times daily. 07/10/15   Tanna Furry, MD  ondansetron (ZOFRAN) 4 MG tablet Take 1 tablet (4 mg total) by mouth every 6 (six) hours. Patient not taking: Reported on 04/17/2015 04/09/15   Lahoma Crocker Barrett, PA-C   BP 97/69 mmHg  Pulse 86  Temp(Src) 97.6 F (36.4 C) (Oral)  Resp 14  Ht 5\' 5"  (1.651 m)  Wt 205 lb (92.987 kg)  BMI 34.11 kg/m2  SpO2 99%  LMP 05/30/2015 Physical Exam  ED Course  Procedures (including critical care time) Labs Review Labs Reviewed - No data to display  Imaging Review No results found. I have personally reviewed and evaluated these images and lab results as part of my medical decision-making.   EKG Interpretation None      MDM   Final diagnoses:  Other migraine without status migrainosus, not intractable    Patient given Toradol, Reglan, Benadryl initially with 1 L fluid. Her headache went from a "11" to a 45. With IV Depakote her headache resolves. Taking by mouth. Repeat exam is normal. Ambulatory. Tolerating lights in the room. Potential discharge home with by mouth Reglan, Benadryl, naproxen as needed for recurrence.    Tanna Furry, MD 07/10/15 1326

## 2015-07-10 NOTE — ED Notes (Signed)
Hx of migraines, but "never this bad." Migraine started last night, "it's near the front, my temples, and the top of my head." Denies neck stiffness, cough, fever, vomiting. C/o nausea. No obvious neuro deficits in triage, PERRLA

## 2015-10-27 ENCOUNTER — Telehealth: Payer: Self-pay

## 2015-10-27 NOTE — Telephone Encounter (Signed)
Returned patient call for Tramadol refill. Pt. Stated she already refilled med twice, Routed to provider for review.

## 2015-10-28 ENCOUNTER — Other Ambulatory Visit: Payer: Self-pay | Admitting: Certified Nurse Midwife

## 2015-10-28 ENCOUNTER — Telehealth: Payer: Self-pay

## 2015-10-28 DIAGNOSIS — N946 Dysmenorrhea, unspecified: Secondary | ICD-10-CM

## 2015-10-28 MED ORDER — TRAMADOL HCL 50 MG PO TABS: 50.0000 mg | ORAL_TABLET | Freq: Two times a day (BID) | ORAL | 2 refills | Status: DC | PRN

## 2015-10-28 NOTE — Telephone Encounter (Signed)
Please let her know that I have printed a script for her and that she is only to take it if needed during her cycle.  Thank you.  R.Aidah Forquer CNM

## 2015-10-28 NOTE — Telephone Encounter (Signed)
S/w patient and informed that prescription can be picked up in office.

## 2016-01-29 ENCOUNTER — Other Ambulatory Visit (HOSPITAL_BASED_OUTPATIENT_CLINIC_OR_DEPARTMENT_OTHER): Payer: Self-pay

## 2016-01-29 DIAGNOSIS — G4700 Insomnia, unspecified: Secondary | ICD-10-CM

## 2016-01-29 DIAGNOSIS — G4710 Hypersomnia, unspecified: Secondary | ICD-10-CM

## 2016-01-29 DIAGNOSIS — R5383 Other fatigue: Secondary | ICD-10-CM

## 2016-02-08 ENCOUNTER — Other Ambulatory Visit: Payer: Self-pay | Admitting: Certified Nurse Midwife

## 2016-02-08 ENCOUNTER — Telehealth: Payer: Self-pay | Admitting: *Deleted

## 2016-02-08 DIAGNOSIS — N946 Dysmenorrhea, unspecified: Secondary | ICD-10-CM

## 2016-02-08 MED ORDER — TRAMADOL HCL 50 MG PO TABS: 50.0000 mg | ORAL_TABLET | Freq: Two times a day (BID) | ORAL | 2 refills | Status: AC | PRN

## 2016-02-08 NOTE — Telephone Encounter (Signed)
Pt called to office for refill on Tramadol.  Pt advised request will be sent to provider for review.    Please advise on Refill of Tramadol.

## 2016-02-08 NOTE — Telephone Encounter (Signed)
Please let her know that she can pick up a script tomorrow.  Thank  You.  R.Melissa Tomaselli CNM

## 2016-02-10 NOTE — Telephone Encounter (Signed)
Pt made aware she could pick up Rx at front desk.

## 2016-03-07 ENCOUNTER — Ambulatory Visit (HOSPITAL_BASED_OUTPATIENT_CLINIC_OR_DEPARTMENT_OTHER): Payer: Medicaid Other | Attending: Family Medicine | Admitting: Internal Medicine

## 2016-03-07 VITALS — Ht 65.0 in | Wt 205.0 lb

## 2016-03-07 DIAGNOSIS — R400 Somnolence: Secondary | ICD-10-CM | POA: Insufficient documentation

## 2016-03-07 DIAGNOSIS — G4710 Hypersomnia, unspecified: Secondary | ICD-10-CM

## 2016-03-07 DIAGNOSIS — R5383 Other fatigue: Secondary | ICD-10-CM | POA: Diagnosis not present

## 2016-03-07 DIAGNOSIS — G4700 Insomnia, unspecified: Secondary | ICD-10-CM | POA: Insufficient documentation

## 2016-03-19 NOTE — Procedures (Signed)
   Patient Name: Joy Pittman, Joy Pittman Date: 03/07/2016 Gender: Female D.O.B: 20-Apr-1980 Age (years): 35 Referring Provider: Lin Landsman Height (inches): 65 Interpreting Physician: Baird Lyons MD, ABSM Weight (lbs): 205 RPSGT: Madelon Lips BMI: 34 MRN: [id number] Neck Size: 14.50 CLINICAL INFORMATION Sleep Study Type: NPSG   Indication for sleep study: Excessive Daytime Sleepiness, Fatigue, Obesity, Snoring  Epworth Sleepiness Score: 2  SLEEP STUDY TECHNIQUE As per the AASM Manual for the Scoring of Sleep and Associated Events v2.3 (April 2016) with a hypopnea requiring 4% desaturations.  The channels recorded and monitored were frontal, central and occipital EEG, electrooculogram (EOG), submentalis EMG (chin), nasal and oral airflow, thoracic and abdominal wall motion, anterior tibialis EMG, snore microphone, electrocardiogram, and pulse oximetry.  MEDICATIONS Medications self-administered by patient taken the night of the study : TEMAZEPAM  SLEEP ARCHITECTURE The study was initiated at 12:02:57 AM and ended at 6:14:18 AM.  Sleep onset time was 0.0 minutes and the sleep efficiency was 88.7%. The total sleep time was 329.4 minutes.  Stage REM latency was 36.5 minutes.  The patient spent 8.05% of the night in stage N1 sleep, 74.39% in stage N2 sleep, 0.30% in stage N3 and 17.26% in REM.  Alpha intrusion was absent.  Supine sleep was 79.56%.  RESPIRATORY PARAMETERS The overall apnea/hypopnea index (AHI) was 0.0 per hour. There were 0 total apneas, including 0 obstructive, 0 central and 0 mixed apneas. There were 0 hypopneas and 1 RERAs.  The AHI during Stage REM sleep was 0.0 per hour.  AHI while supine was 0.0 per hour.  The mean oxygen saturation was 98.02%. The minimum SpO2 during sleep was 95.00%.  Soft snoring was noted during this study.  CARDIAC DATA The 2 lead EKG demonstrated sinus rhythm. The mean heart rate was 69.56 beats per minute. Other EKG  findings include: None.  LEG MOVEMENT DATA The total PLMS were 0 with a resulting PLMS index of 0.00. Associated arousal with leg movement index was 0.0 .  IMPRESSIONS - No significant obstructive sleep apnea occurred during this study (AHI = 0.0/h). - No significant central sleep apnea occurred during this study (CAI = 0.0/h). - Insignificant oxygen desaturation was noted during this study (Min O2 = 95.00%). - The patient snored with Soft snoring volume. - No cardiac abnormalities were noted during this study. - Clinically significant periodic limb movements did not occur during sleep. No significant associated arousals.  DIAGNOSIS - Normal study  RECOMMENDATIONS - Avoid alcohol, sedatives and other CNS depressants that may worsen sleep apnea and disrupt normal sleep architecture. - Sleep hygiene should be reviewed to assess factors that may improve sleep quality. - Weight management and regular exercise should be initiated or continued if appropriate.  [Electronically signed] 03/19/2016 11:11 AM  Baird Lyons MD, ABSM Diplomate, American Board of Sleep Medicine   NPI: [id number] [place], American Board of Sleep Medicine  ELECTRONICALLY SIGNED ON:  03/19/2016, 11:10 AM [place] PH: [phone]   FX: [phone] [place]

## 2016-03-23 ENCOUNTER — Emergency Department (HOSPITAL_COMMUNITY)
Admission: EM | Admit: 2016-03-23 | Discharge: 2016-03-23 | Disposition: A | Discharge status: Home / Self Care | Payer: Medicaid Other | Attending: Emergency Medicine | Admitting: Emergency Medicine

## 2016-03-23 ENCOUNTER — Emergency Department (HOSPITAL_COMMUNITY): Payer: Medicaid Other

## 2016-03-23 ENCOUNTER — Encounter (HOSPITAL_COMMUNITY): Payer: Self-pay

## 2016-03-23 DIAGNOSIS — R102 Pelvic and perineal pain: Secondary | ICD-10-CM | POA: Diagnosis present

## 2016-03-23 DIAGNOSIS — D259 Leiomyoma of uterus, unspecified: Secondary | ICD-10-CM | POA: Insufficient documentation

## 2016-03-23 DIAGNOSIS — F909 Attention-deficit hyperactivity disorder, unspecified type: Secondary | ICD-10-CM | POA: Diagnosis not present

## 2016-03-23 DIAGNOSIS — Z79899 Other long term (current) drug therapy: Secondary | ICD-10-CM | POA: Insufficient documentation

## 2016-03-23 LAB — COMPREHENSIVE METABOLIC PANEL
ALBUMIN: 3.8 g/dL (ref 3.5–5.0)
ALT: 10 U/L — ABNORMAL LOW (ref 14–54)
ANION GAP: 5 (ref 5–15)
AST: 16 U/L (ref 15–41)
Alkaline Phosphatase: 56 U/L (ref 38–126)
BUN: 9 mg/dL (ref 6–20)
CHLORIDE: 108 mmol/L (ref 101–111)
CO2: 26 mmol/L (ref 22–32)
Calcium: 9.3 mg/dL (ref 8.9–10.3)
Creatinine, Ser: 0.65 mg/dL (ref 0.44–1.00)
GFR calc Af Amer: >60 mL/min (ref >60)
GFR calc non Af Amer: >60 mL/min (ref >60)
GLUCOSE: 97 mg/dL (ref 65–99)
POTASSIUM: 3.9 mmol/L (ref 3.5–5.1)
Sodium: 139 mmol/L (ref 135–145)
TOTAL PROTEIN: 7.4 g/dL (ref 6.5–8.1)
Total Bilirubin: 0.4 mg/dL (ref 0.3–1.2)

## 2016-03-23 LAB — URINALYSIS, ROUTINE W REFLEX MICROSCOPIC
Bilirubin Urine: NEGATIVE
GLUCOSE, UA: NEGATIVE mg/dL
KETONES UR: NEGATIVE mg/dL
NITRITE: NEGATIVE
PROTEIN: NEGATIVE mg/dL
Specific Gravity, Urine: 1.017 (ref 1.005–1.030)
pH: 8 (ref 5.0–8.0)

## 2016-03-23 LAB — LIPASE, BLOOD: LIPASE: 22 U/L (ref 11–51)

## 2016-03-23 LAB — I-STAT BETA HCG BLOOD, ED (MC, WL, AP ONLY)

## 2016-03-23 LAB — CBC
HEMATOCRIT: 36.4 % (ref 36.0–46.0)
Hemoglobin: 11.8 g/dL — ABNORMAL LOW (ref 12.0–15.0)
MCH: 26.3 pg (ref 26.0–34.0)
MCHC: 32.4 g/dL (ref 30.0–36.0)
MCV: 81.1 fL (ref 78.0–100.0)
Platelets: 261 10*3/uL (ref 150–400)
RBC: 4.49 MIL/uL (ref 3.87–5.11)
RDW: 14.8 % (ref 11.5–15.5)
WBC: 4 10*3/uL (ref 4.0–10.5)

## 2016-03-23 LAB — WET PREP, GENITAL
Clue Cells Wet Prep HPF POC: NONE SEEN
SPERM: NONE SEEN
Trich, Wet Prep: NONE SEEN
Yeast Wet Prep HPF POC: NONE SEEN

## 2016-03-23 MED ORDER — OXYCODONE-ACETAMINOPHEN 5-325 MG PO TABS: 1.0000 | ORAL_TABLET | Freq: Four times a day (QID) | ORAL | 0 refills | Status: DC | PRN

## 2016-03-23 MED ORDER — OXYCODONE-ACETAMINOPHEN 5-325 MG PO TABS
2.0000 | ORAL_TABLET | Freq: Once | ORAL | Status: AC
  Administered 2016-03-23: 2 via ORAL
  Filled 2016-03-23: qty 2

## 2016-03-23 NOTE — ED Notes (Signed)
Called for patient.  Not found in lobby

## 2016-03-23 NOTE — ED Triage Notes (Signed)
Pt with abdominal pain since Monday.  No n/v/d.  No vaginal discharge, fever or urinary symptoms.

## 2016-03-23 NOTE — ED Provider Notes (Signed)
[facility] DEPT Provider Note   CSN: [id number] Arrival date & time: 03/23/16  9833     History   Chief Complaint Chief Complaint  Patient presents with  . Abdominal Pain    HPI Joy Pittman is a 36 y.o. female.  Patient with history of tubal ligation, ectopic pregnancy -- presents with complaint of sharp stabbing pains in her pelvis x 2 days. Patient has had pain like this before. States a previous occasion she was diagnosed with ovarian cysts. Symptoms currently are more severe and more prolonged than usual for her. No fevers, vomiting, or diarrhea. No urinary symptoms. No vaginal bleeding or discharge. Patient is sexually active. She does not think that she is at risk for sexually transmitted disease. No treatments prior to arrival. The onset of this condition was acute. The course is constant. Aggravating factors: none. Alleviating factors: none.        Past Medical History:  Diagnosis Date  . ADHD (attention deficit hyperactivity disorder)   . Bipolar 1 disorder ([place])   . PTSD (post-traumatic stress disorder)   . UTI (lower urinary tract infection)     Patient Active Problem List   Diagnosis Date Noted  . Dysmenorrhea 06/17/2015  . Bipolar affective ([place]) 04/19/2012  . PTSD (post-traumatic stress disorder) 04/19/2012  . Pelvic pain 04/19/2012    Past Surgical History:  Procedure Laterality Date  . ECTOPIC PREGNANCY SURGERY    . LAPAROSCOPY    . TUBAL LIGATION      OB History    Gravida Para Term Preterm AB Living   4 3 3   1 3    SAB TAB Ectopic Multiple Live Births       1           Home Medications    Prior to Admission medications   Medication Sig Start Date End Date Taking? Authorizing Provider  ADDERALL XR 20 MG 24 hr capsule Take 1 capsule by mouth 2 (two) times daily. 08/11/14  Yes Historical Provider, MD  FLUoxetine (PROZAC) 20 MG capsule Take 20 mg by mouth 2 (two) times daily.   Yes Historical Provider, MD  LORazepam (ATIVAN) 1 MG  tablet Take 1 mg by mouth every 8 (eight) hours as needed for anxiety or sleep. For anxiety.    Yes Historical Provider, MD  nitrofurantoin, macrocrystal-monohydrate, (MACROBID) 100 MG capsule Take 100 mg by mouth daily. Takes continuously   Yes Historical Provider, MD  Phenyleph-Doxylamine-DM-APAP (NYQUIL SEVERE COLD/FLU) 5-6.25-10-325 MG/15ML LIQD Take 30 mLs by mouth at bedtime as needed (for sleep/cold).   Yes Historical Provider, MD  temazepam (RESTORIL) 15 MG capsule Take 15 mg by mouth at bedtime as needed for sleep. 02/23/16  Yes Historical Provider, MD  traMADol (ULTRAM) 50 MG tablet Take 1 tablet (50 mg total) by mouth every 12 (twelve) hours as needed for moderate pain or severe pain. During your menstrual cycle. 02/08/16  Yes Rachelle Rosaland Lao, CNM    Family History Family History  Problem Relation Age of Onset  . Cancer Other   . Hypertension Other   . Diabetes Other   . Drug abuse Paternal Grandfather     Social History Social History  Substance Use Topics  . Smoking status: Never Smoker  . Smokeless tobacco: Never Used  . Alcohol use No     Allergies   Kiwi extract and Penicillins   Review of Systems Review of Systems  Constitutional: Negative for fever.  HENT: Negative for rhinorrhea and sore throat.  Eyes: Negative for redness.  Respiratory: Negative for cough.   Cardiovascular: Negative for chest pain.  Gastrointestinal: Negative for abdominal pain, diarrhea, nausea and vomiting.  Genitourinary: Positive for pelvic pain. Negative for dysuria, vaginal bleeding and vaginal discharge.  Musculoskeletal: Negative for myalgias.  Skin: Negative for rash.  Neurological: Negative for headaches.     Physical Exam Updated Vital Signs BP 96/68 (BP Location: Right Arm)   Pulse (!) 55   Temp 98.2 F (36.8 C) (Oral)   Resp 17   SpO2 100%   Physical Exam  Constitutional: She appears well-developed and well-nourished.  HENT:  Head: Normocephalic and atraumatic.    Eyes: Conjunctivae are normal. Right eye exhibits no discharge. Left eye exhibits no discharge.  Neck: Normal range of motion. Neck supple.  Cardiovascular: Normal rate, regular rhythm and normal heart sounds.   Pulmonary/Chest: Effort normal and breath sounds normal. No respiratory distress. She has no wheezes. She has no rales.  Abdominal: Soft. She exhibits no mass. There is tenderness in the suprapubic area. There is no rebound and no guarding.  Genitourinary: Vagina normal. Pelvic exam was performed with patient prone. There is no rash or tenderness on the right labia. There is no rash or tenderness on the left labia. Uterus is not tender. Cervix exhibits no motion tenderness, no discharge and no friability. Right adnexum displays tenderness (mild). Right adnexum displays no mass and no fullness. Left adnexum displays tenderness (mild). Left adnexum displays no mass and no fullness. No tenderness or bleeding in the vagina. No signs of injury around the vagina. No vaginal discharge found.  Neurological: She is alert.  Skin: Skin is warm and dry.  Psychiatric: She has a normal mood and affect.  Nursing note and vitals reviewed.    ED Treatments / Results  Labs (all labs ordered are listed, but only abnormal results are displayed) Labs Reviewed  WET PREP, GENITAL - Abnormal; Notable for the following:       Result Value   WBC, Wet Prep HPF POC FEW (*)    All other components within normal limits  COMPREHENSIVE METABOLIC PANEL - Abnormal; Notable for the following:    ALT 10 (*)    All other components within normal limits  CBC - Abnormal; Notable for the following:    Hemoglobin 11.8 (*)    All other components within normal limits  URINALYSIS, ROUTINE W REFLEX MICROSCOPIC - Abnormal; Notable for the following:    APPearance HAZY (*)    Hgb urine dipstick SMALL (*)    Leukocytes, UA SMALL (*)    Bacteria, UA RARE (*)    Squamous Epithelial / LPF 0-5 (*)    All other components  within normal limits  URINE CULTURE  LIPASE, BLOOD  I-STAT BETA HCG BLOOD, ED ([facility], [facility], [facility] ONLY)  GC/CHLAMYDIA PROBE AMP ([place]) NOT AT [facility]     Radiology US Transvaginal Non-ob  Result Date: 03/23/2016 CLINICAL DATA:  Pelvic pain for 3 days. History of urinary tract infection, ectopic pregnancy. EXAM: TRANSABDOMINAL AND TRANSVAGINAL ULTRASOUND OF PELVIS TECHNIQUE: Both transabdominal and transvaginal ultrasound examinations of the pelvis were performed. Transabdominal technique was performed for global imaging of the pelvis including uterus, ovaries, adnexal regions, and pelvic cul-de-sac. It was necessary to proceed with endovaginal exam following the transabdominal exam to visualize the ovaries and adnexa. COMPARISON:  Pelvic ultrasound June 23, 2015. FINDINGS: Uterus Measurements: 8.9 x 4.6 x 5.8 cm. 2.2 x 1.7 x 1.7 cm subserosal isoechoic leiomyoma RIGHT uterine  fundus. Endometrium Thickness: 7 mm.  No focal abnormality visualized. Right ovary Measurements: 2.3 x 2.4 x 2.2 cm. Normal appearance/no adnexal mass. Left ovary Measurements: 2.4 x 2 x 1.7 cm. Normal appearance/no adnexal mass. Other findings No abnormal free fluid. IMPRESSION: No acute process in the pelvis. Similar 2.2 cm subserosal leiomyoma. Electronically Signed   By: Elon Alas M.D.   On: 03/23/2016 14:16   US Pelvis Complete  Result Date: 03/23/2016 CLINICAL DATA:  Pelvic pain for 3 days. History of urinary tract infection, ectopic pregnancy. EXAM: TRANSABDOMINAL AND TRANSVAGINAL ULTRASOUND OF PELVIS TECHNIQUE: Both transabdominal and transvaginal ultrasound examinations of the pelvis were performed. Transabdominal technique was performed for global imaging of the pelvis including uterus, ovaries, adnexal regions, and pelvic cul-de-sac. It was necessary to proceed with endovaginal exam following the transabdominal exam to visualize the ovaries and adnexa. COMPARISON:  Pelvic ultrasound June 23, 2015. FINDINGS: Uterus  Measurements: 8.9 x 4.6 x 5.8 cm. 2.2 x 1.7 x 1.7 cm subserosal isoechoic leiomyoma RIGHT uterine fundus. Endometrium Thickness: 7 mm.  No focal abnormality visualized. Right ovary Measurements: 2.3 x 2.4 x 2.2 cm. Normal appearance/no adnexal mass. Left ovary Measurements: 2.4 x 2 x 1.7 cm. Normal appearance/no adnexal mass. Other findings No abnormal free fluid. IMPRESSION: No acute process in the pelvis. Similar 2.2 cm subserosal leiomyoma. Electronically Signed   By: Elon Alas M.D.   On: 03/23/2016 14:16    Procedures Procedures (including critical care time)  Medications Ordered in ED Medications  oxyCODONE-acetaminophen (PERCOCET/ROXICET) 5-325 MG per tablet 2 tablet (2 tablets Oral Given 03/23/16 1452)     Initial Impression / Assessment and Plan / ED Course  I have reviewed the triage vital signs and the nursing notes.  Pertinent labs & imaging results that were available during my care of the patient were reviewed by me and considered in my medical decision making (see chart for details).     Patient seen and examined. Work-up initiated. Medications ordered.   Vital signs reviewed and are as follows: BP 96/68 (BP Location: Right Arm)   Pulse (!) 55   Temp 98.2 F (36.8 C) (Oral)   Resp 17   SpO2 100%    Pelvic exam performed with NT chaperone.   US performed. Pt updated on results. Encouraged That she follow up with her GYN as planned in one week.  The patient was urged to return to the Emergency Department immediately with worsening of current symptoms, worsening abdominal pain, persistent vomiting, blood noted in stools, fever, or any other concerns. The patient verbalized understanding.   Patient counseled on use of narcotic pain medications. Counseled not to combine these medications with others containing tylenol. Urged not to drink alcohol, drive, or perform any other activities that requires focus while taking these medications. The patient verbalizes  understanding and agrees with the plan.   Final Clinical Impressions(s) / ED Diagnoses   Final diagnoses:  Pelvic pain  Uterine leiomyoma, unspecified location   Pelvic pain: Acute on chronic. Patient is not pregnant. Low suspicion for STI/PID. Patient does have a uterine fibroid noted. She has a history of chronic urinary tract infections. If her culture is positive, I would likely treat. Labs otherwise reassuring. Patient has upcoming GYN appointment. Given pain control. Return instructions as above. No indications for CT imaging at this time.  New Prescriptions New Prescriptions   OXYCODONE-ACETAMINOPHEN (PERCOCET/ROXICET) 5-325 MG TABLET    Take 1-2 tablets by mouth every 6 (six) hours as needed for severe  pain.     Carlisle Cater, PA-C 03/23/16 [place] Yao, MD 03/23/16 [phone]

## 2016-03-23 NOTE — Discharge Instructions (Signed)
Please read and follow all provided instructions.  Your diagnoses today include:  1. Pelvic pain   2. Uterine leiomyoma, unspecified location     Tests performed today include:  Blood counts and electrolytes  Ultrasound - normal except for fibroid noted  Vital signs. See below for your results today.   Medications prescribed:   Percocet (oxycodone/acetaminophen) - narcotic pain medication  DO NOT drive or perform any activities that require you to be awake and alert because this medicine can make you drowsy. BE VERY CAREFUL not to take multiple medicines containing Tylenol (also called acetaminophen). Doing so can lead to an overdose which can damage your liver and cause liver failure and possibly death.  Take any prescribed medications only as directed.  Home care instructions:  Follow any educational materials contained in this packet.  BE VERY CAREFUL not to take multiple medicines containing Tylenol (also called acetaminophen). Doing so can lead to an overdose which can damage your liver and cause liver failure and possibly death.   Follow-up instructions: Please follow-up with your GYN as planned days for further evaluation of your symptoms.   Return instructions:   Please return to the Emergency Department if you experience worsening symptoms.   Return with uncontrolled pain, vomiting, blood in stool.   Please return if you have any other emergent concerns.  Additional Information:  Your vital signs today were: BP 96/68 (BP Location: Right Arm)    Pulse (!) 55    Temp 98.2 F (36.8 C) (Oral)    Resp 17    SpO2 100%  If your blood pressure (BP) was elevated above 135/85 this visit, please have this repeated by your doctor within one month. --------------

## 2016-03-23 NOTE — ED Notes (Signed)
Bed: [facility] Expected date:  Expected time:  Means of arrival:  Comments: 

## 2016-03-24 LAB — GC/CHLAMYDIA PROBE AMP (~~LOC~~) NOT AT ARMC
CHLAMYDIA, DNA PROBE: NEGATIVE
Neisseria Gonorrhea: NEGATIVE

## 2016-03-25 LAB — URINE CULTURE

## 2016-03-29 ENCOUNTER — Encounter: Payer: Self-pay | Admitting: Obstetrics

## 2016-03-29 ENCOUNTER — Ambulatory Visit (INDEPENDENT_AMBULATORY_CARE_PROVIDER_SITE_OTHER): Payer: Medicaid Other | Admitting: Obstetrics

## 2016-03-29 VITALS — BP 118/79 | HR 78 | Ht 65.0 in | Wt 210.0 lb

## 2016-03-29 DIAGNOSIS — R102 Pelvic and perineal pain: Secondary | ICD-10-CM

## 2016-03-29 DIAGNOSIS — N946 Dysmenorrhea, unspecified: Secondary | ICD-10-CM | POA: Diagnosis not present

## 2016-03-29 DIAGNOSIS — G8929 Other chronic pain: Secondary | ICD-10-CM | POA: Diagnosis not present

## 2016-03-29 NOTE — Progress Notes (Signed)
Pt presents for dysmenorrhea. Pelvic u/s completed 03/23/16 shows Measurements: 8.9 x 4.6 x 5.8 cm. 2.2 x 1.7 x 1.7 cm subserosal isoechoic leiomyoma RIGHT uterine fundus

## 2016-03-30 NOTE — Progress Notes (Signed)
Patient ID: Joy Pittman, female   DOB: 1980/12/22, 36 y.o.   MRN: [id number]  Chief Complaint  Patient presents with  . Dysmenorrhea    HPI Joy Pittman is a 36 y.o. female.  Patient presents for ultrasound results.  Has history of painful periods since she had tubal ligation done.  Now complains of pain all month.  She has had previous laparoscopy for ectopic pregnancy and pelvic pain. HPI  Past Medical History:  Diagnosis Date  . ADHD (attention deficit hyperactivity disorder)   . Bipolar 1 disorder ([place])   . PTSD (post-traumatic stress disorder)   . UTI (lower urinary tract infection)     Past Surgical History:  Procedure Laterality Date  . ECTOPIC PREGNANCY SURGERY    . LAPAROSCOPY    . TUBAL LIGATION      Family History  Problem Relation Age of Onset  . Cancer Other   . Hypertension Other   . Diabetes Other   . Drug abuse Paternal Grandfather     Social History Social History  Substance Use Topics  . Smoking status: Never Smoker  . Smokeless tobacco: Never Used  . Alcohol use 0.0 oz/week     Comment: OCC    Allergies  Allergen Reactions  . Kiwi Extract Itching and Swelling    Swelling of lips   . Penicillins Hives, Itching and Swelling    Tolerates amoxicillin, but states it will still make her itch.  Swelling of lips.  Has patient had a PCN reaction causing immediate rash, facial/tongue/throat swelling, SOB or lightheadedness with hypotension: Yes Has patient had a PCN reaction causing severe rash involving mucus membranes or skin necrosis: No Has patient had a PCN reaction that required hospitalization No Has patient had a PCN reaction occurring within the last 10 years: Yes If all of the above answers are "NO", then may proceed w    Current Outpatient Prescriptions  Medication Sig Dispense Refill  . ADDERALL XR 20 MG 24 hr capsule Take 1 capsule by mouth 2 (two) times daily.  0  . FLUoxetine (PROZAC) 20 MG capsule Take 20 mg by mouth 2 (two) times  daily.    Marland Kitchen LORazepam (ATIVAN) 1 MG tablet Take 1 mg by mouth every 8 (eight) hours as needed for anxiety or sleep. For anxiety.     . Lurasidone HCl (LATUDA) 60 MG TABS Take by mouth.    . nitrofurantoin, macrocrystal-monohydrate, (MACROBID) 100 MG capsule Take 100 mg by mouth daily. Takes continuously    . temazepam (RESTORIL) 15 MG capsule Take 15 mg by mouth at bedtime as needed for sleep.  0  . traMADol (ULTRAM) 50 MG tablet Take 1 tablet (50 mg total) by mouth every 12 (twelve) hours as needed for moderate pain or severe pain. During your menstrual cycle. 60 tablet 2  . oxyCODONE-acetaminophen (PERCOCET/ROXICET) 5-325 MG tablet Take 1-2 tablets by mouth every 6 (six) hours as needed for severe pain. (Patient not taking: Reported on 03/29/2016) 8 tablet 0  . Phenyleph-Doxylamine-DM-APAP (NYQUIL SEVERE COLD/FLU) 5-6.25-10-325 MG/15ML LIQD Take 30 mLs by mouth at bedtime as needed (for sleep/cold).     No current facility-administered medications for this visit.     Review of Systems Review of Systems Constitutional: negative for fatigue and weight loss Respiratory: negative for cough and wheezing Cardiovascular: negative for chest pain, fatigue and palpitations Gastrointestinal: negative for abdominal pain and change in bowel habits Genitourinary:positive for pelvic pain Integument/breast: negative for nipple discharge Musculoskeletal:negative for myalgias Neurological:  negative for gait problems and tremors Behavioral/Psych: negative for abusive relationship, depression Endocrine: negative for temperature intolerance      Blood pressure 118/79, pulse 78, height 5\' 5"  (1.651 m), weight 210 lb (95.3 kg), last menstrual period 03/16/2016.  Physical Exam Physical Exam:  Deferred 50% of 10 min visit spent on counseling and coordination of care.    Data Reviewed Ultrasound Labs  Assessment     Chronic Pelvic Pain.  Ultrasound reveals 2 small subserosal fibroids, otherwise WNL's.   The patient's pain is out of proportion to ultrasound findings.  More specialized assessment of this pain is needed. Severe Dysmenorrhea after tubal ligation    Plan    Recommended referral to [facility] for management of Chronic Pelvic Pain F/U prn  No orders of the defined types were placed in this encounter.  Meds ordered this encounter  Medications  . Lurasidone HCl (LATUDA) 60 MG TABS    Sig: Take by mouth.

## 2016-04-05 ENCOUNTER — Encounter (HOSPITAL_COMMUNITY): Payer: Self-pay

## 2016-04-05 ENCOUNTER — Encounter: Payer: Self-pay | Admitting: Obstetrics & Gynecology

## 2016-04-05 ENCOUNTER — Ambulatory Visit (INDEPENDENT_AMBULATORY_CARE_PROVIDER_SITE_OTHER): Payer: Medicaid Other | Admitting: Obstetrics & Gynecology

## 2016-04-05 VITALS — BP 119/79 | HR 78 | Ht 65.0 in | Wt 212.0 lb

## 2016-04-05 DIAGNOSIS — R102 Pelvic and perineal pain: Secondary | ICD-10-CM | POA: Diagnosis not present

## 2016-04-05 DIAGNOSIS — G8929 Other chronic pain: Secondary | ICD-10-CM

## 2016-04-05 DIAGNOSIS — N946 Dysmenorrhea, unspecified: Secondary | ICD-10-CM

## 2016-04-05 NOTE — Progress Notes (Signed)
   Subjective:    Patient ID: Briscoe Burns, female    DOB: [birth year], 36 y.o.   MRN: [id number]  HPI 36 yo M AA P3 (44, 71, and 34 yo kids) here with a decade of CPP. I did a laparoscopy on her in 2011 and found a few adhesions. She has had an u/s recently that showed 2 small fibroids. She describes the pain as "beyond a 10 on the pain scale", her pain is all month long, worse with her periods, pain with sex for about 2 years. She is taking tramadol prescribed by Kandis Cocking, NP.  IBU doesn't help. She has tried OCPs with no help. She wants a hysterectomy.   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     Objective:   Physical Exam [id number] Breathing, conversing, and ambulating normally Abd- benign NSSA, NT, mobile, normal adnexa       Assessment & Plan:  Chronic pelvic pain -desires a hysterectomy I have agreed to do a LAVH.  She understands the risks of surgery, including, but not to infection, bleeding, DVTs, damage to bowel, bladder, ureters. She wishes to proceed.

## 2016-05-27 NOTE — Patient Instructions (Signed)
Your procedure is scheduled on:  Tuesday, Jun 07, 2016  Enter through the [employer] [facility] at:  6:00 AM  Pick up the phone at the desk and dial [phone].  Call this number if you have problems the morning of surgery: [phone].  Remember: Do NOT eat food or drink after:  Midnight Monday  Take these medicines the morning of surgery with a SIP OF WATER:  Fluoxetine, Macrobid  Stop ALL herbal medications at this time  Do NOT smoke the day of surgery.  Do NOT wear jewelry (body piercing), metal hair clips/bobby pins, make-up, or nail polish. Do NOT wear lotions, powders, or perfumes.  You may wear deodorant. Do NOT shave for 48 hours prior to surgery. Do NOT bring valuables to the hospital. Contacts, dentures, or bridgework may not be worn into surgery.  Leave suitcase in car.  After surgery it may be brought to your room.  For patients admitted to the hospital, checkout time is 11:00 AM the day of discharge.  Bring a copy of your healthcare power of attorney and living will documents.

## 2016-05-30 ENCOUNTER — Encounter (HOSPITAL_COMMUNITY)
Admission: RE | Admit: 2016-05-30 | Discharge: 2016-05-30 | Disposition: A | Discharge status: Home / Self Care | Payer: Medicaid Other | Source: Ambulatory Visit | Attending: Obstetrics & Gynecology | Admitting: Obstetrics & Gynecology

## 2016-05-30 ENCOUNTER — Encounter (HOSPITAL_COMMUNITY): Payer: Self-pay

## 2016-05-30 DIAGNOSIS — Z01812 Encounter for preprocedural laboratory examination: Secondary | ICD-10-CM | POA: Diagnosis not present

## 2016-05-30 HISTORY — DX: Headache, unspecified: R51.9

## 2016-05-30 HISTORY — DX: Headache: R51

## 2016-05-30 LAB — CBC
HCT: 36.1 % (ref 36.0–46.0)
Hemoglobin: 11.6 g/dL — ABNORMAL LOW (ref 12.0–15.0)
MCH: 26.2 pg (ref 26.0–34.0)
MCHC: 32.1 g/dL (ref 30.0–36.0)
MCV: 81.7 fL (ref 78.0–100.0)
PLATELETS: 270 10*3/uL (ref 150–400)
RBC: 4.42 MIL/uL (ref 3.87–5.11)
RDW: 15.3 % (ref 11.5–15.5)
WBC: 5.1 10*3/uL (ref 4.0–10.5)

## 2016-05-31 ENCOUNTER — Other Ambulatory Visit: Payer: Self-pay | Admitting: *Deleted

## 2016-05-31 DIAGNOSIS — B3731 Acute candidiasis of vulva and vagina: Secondary | ICD-10-CM

## 2016-05-31 DIAGNOSIS — B373 Candidiasis of vulva and vagina: Secondary | ICD-10-CM

## 2016-05-31 MED ORDER — FLUCONAZOLE 150 MG PO TABS: 150.0000 mg | ORAL_TABLET | Freq: Every day | ORAL | 0 refills | Status: DC

## 2016-05-31 NOTE — Progress Notes (Signed)
Pt called to office stating that she is a pt of [facility].  Pt states that she has been on antibiotics and is now having some vaginal itching, request Diflucan. Diflucan 150mg  was sent to pharmacy per protocol.

## 2016-06-06 ENCOUNTER — Encounter (HOSPITAL_COMMUNITY): Payer: Self-pay | Admitting: Anesthesiology

## 2016-06-06 NOTE — Anesthesia Preprocedure Evaluation (Addendum)
Anesthesia Evaluation  Patient identified by MRN, date of birth, ID band Patient awake    Reviewed: Allergy & Precautions, NPO status , Patient's Chart, lab work & pertinent test results  Airway Mallampati: II  TM Distance: >3 FB Neck ROM: Full    Dental no notable dental hx. (+) Teeth Intact   Pulmonary neg pulmonary ROS,    Pulmonary exam normal breath sounds clear to auscultation       Cardiovascular negative cardio ROS Normal cardiovascular exam Rhythm:Regular Rate:Normal     Neuro/Psych  Headaches, PSYCHIATRIC DISORDERS Bipolar Disorder PTSD ADHD   GI/Hepatic negative GI ROS, Neg liver ROS,   Endo/Other  Obesity  Renal/GU negative Renal ROS  negative genitourinary   Musculoskeletal negative musculoskeletal ROS (+)   Abdominal (+) + obese,   Peds  Hematology negative hematology ROS (+)   Anesthesia Other Findings   Reproductive/Obstetrics Pelvic pain                            Anesthesia Physical Anesthesia Plan  ASA: II  Anesthesia Plan: General   Post-op Pain Management:    Induction: Intravenous  Airway Management Planned: Oral ETT  Additional Equipment:   Intra-op Plan:   Post-operative Plan: Extubation in OR  Informed Consent: I have reviewed the patients History and Physical, chart, labs and discussed the procedure including the risks, benefits and alternatives for the proposed anesthesia with the patient or authorized representative who has indicated his/her understanding and acceptance.   Dental advisory given  Plan Discussed with: Anesthesiologist, CRNA and Surgeon  Anesthesia Plan Comments:        Anesthesia Quick Evaluation

## 2016-06-07 ENCOUNTER — Encounter (HOSPITAL_COMMUNITY)
Admission: RE | Disposition: A | Discharge status: Home / Self Care | Payer: Self-pay | Source: Ambulatory Visit | Attending: Obstetrics & Gynecology

## 2016-06-07 ENCOUNTER — Inpatient Hospital Stay (HOSPITAL_COMMUNITY)
Admission: RE | Admit: 2016-06-07 | Discharge: 2016-06-08 | DRG: 983 | Disposition: A | Discharge status: Home / Self Care | Payer: Medicaid Other | Source: Ambulatory Visit | Attending: Obstetrics & Gynecology | Admitting: Obstetrics & Gynecology

## 2016-06-07 ENCOUNTER — Encounter (HOSPITAL_COMMUNITY): Payer: Self-pay | Admitting: *Deleted

## 2016-06-07 ENCOUNTER — Ambulatory Visit (HOSPITAL_COMMUNITY): Payer: Medicaid Other | Admitting: Anesthesiology

## 2016-06-07 DIAGNOSIS — R102 Pelvic and perineal pain: Secondary | ICD-10-CM | POA: Diagnosis present

## 2016-06-07 DIAGNOSIS — Z6834 Body mass index (BMI) 34.0-34.9, adult: Secondary | ICD-10-CM | POA: Diagnosis not present

## 2016-06-07 DIAGNOSIS — N920 Excessive and frequent menstruation with regular cycle: Secondary | ICD-10-CM | POA: Diagnosis present

## 2016-06-07 DIAGNOSIS — F419 Anxiety disorder, unspecified: Secondary | ICD-10-CM | POA: Diagnosis present

## 2016-06-07 DIAGNOSIS — G8929 Other chronic pain: Secondary | ICD-10-CM | POA: Diagnosis present

## 2016-06-07 DIAGNOSIS — D649 Anemia, unspecified: Secondary | ICD-10-CM | POA: Diagnosis present

## 2016-06-07 DIAGNOSIS — D259 Leiomyoma of uterus, unspecified: Secondary | ICD-10-CM

## 2016-06-07 DIAGNOSIS — E669 Obesity, unspecified: Secondary | ICD-10-CM | POA: Diagnosis present

## 2016-06-07 DIAGNOSIS — Z90710 Acquired absence of both cervix and uterus: Secondary | ICD-10-CM | POA: Diagnosis present

## 2016-06-07 DIAGNOSIS — Z880 Allergy status to penicillin: Secondary | ICD-10-CM | POA: Diagnosis not present

## 2016-06-07 DIAGNOSIS — Z9889 Other specified postprocedural states: Secondary | ICD-10-CM

## 2016-06-07 HISTORY — DX: Depression, unspecified: F32.A

## 2016-06-07 HISTORY — DX: Major depressive disorder, single episode, unspecified: F32.9

## 2016-06-07 HISTORY — DX: Anxiety disorder, unspecified: F41.9

## 2016-06-07 HISTORY — PX: VAGINAL HYSTERECTOMY: SHX2639

## 2016-06-07 LAB — PREGNANCY, URINE: Preg Test, Ur: NEGATIVE

## 2016-06-07 LAB — TYPE AND SCREEN
ABO/RH(D): O POS
ANTIBODY SCREEN: NEGATIVE

## 2016-06-07 LAB — ABO/RH: ABO/RH(D): O POS

## 2016-06-07 SURGERY — HYSTERECTOMY, VAGINAL
Anesthesia: General | Site: Vagina

## 2016-06-07 MED ORDER — PROPOFOL 10 MG/ML IV BOLUS
INTRAVENOUS | Status: DC | PRN
  Administered 2016-06-07: 180 mg via INTRAVENOUS

## 2016-06-07 MED ORDER — SODIUM CHLORIDE 0.9 % IJ SOLN
INTRAMUSCULAR | Status: DC | PRN
  Administered 2016-06-07: 100 mL via INTRAVENOUS

## 2016-06-07 MED ORDER — MIDAZOLAM HCL 2 MG/2ML IJ SOLN
INTRAMUSCULAR | Status: DC | PRN
Start: 2016-06-07 — End: 2016-06-07
  Administered 2016-06-07: 2 mg via INTRAVENOUS

## 2016-06-07 MED ORDER — HYDROMORPHONE HCL 1 MG/ML IJ SOLN
0.2000 mg | INTRAMUSCULAR | Status: DC | PRN
  Administered 2016-06-07 (×2): 0.6 mg via INTRAVENOUS
  Filled 2016-06-07 (×2): qty 1

## 2016-06-07 MED ORDER — ONDANSETRON HCL 4 MG/2ML IJ SOLN: 4.0000 mg | Freq: Four times a day (QID) | INTRAMUSCULAR | Status: DC | PRN

## 2016-06-07 MED ORDER — HYDROMORPHONE HCL 1 MG/ML IJ SOLN
INTRAMUSCULAR | Status: AC
  Administered 2016-06-07: 0.5 mg via INTRAVENOUS
  Filled 2016-06-07: qty 1

## 2016-06-07 MED ORDER — FENTANYL CITRATE (PF) 250 MCG/5ML IJ SOLN
INTRAMUSCULAR | Status: AC
  Filled 2016-06-07: qty 5

## 2016-06-07 MED ORDER — MEPERIDINE HCL 25 MG/ML IJ SOLN: 6.2500 mg | INTRAMUSCULAR | Status: DC | PRN

## 2016-06-07 MED ORDER — ONDANSETRON HCL 4 MG/2ML IJ SOLN
INTRAMUSCULAR | Status: DC | PRN
  Administered 2016-06-07: 4 mg via INTRAVENOUS

## 2016-06-07 MED ORDER — KETOROLAC TROMETHAMINE 30 MG/ML IJ SOLN
INTRAMUSCULAR | Status: DC | PRN
  Administered 2016-06-07: 30 mg via INTRAVENOUS

## 2016-06-07 MED ORDER — SODIUM CHLORIDE 0.9 % IJ SOLN
INTRAMUSCULAR | Status: AC
  Filled 2016-06-07: qty 100

## 2016-06-07 MED ORDER — SCOPOLAMINE 1 MG/3DAYS TD PT72
MEDICATED_PATCH | TRANSDERMAL | Status: AC
  Administered 2016-06-07: 1.5 mg via TRANSDERMAL
  Filled 2016-06-07: qty 1

## 2016-06-07 MED ORDER — HYDROMORPHONE HCL 1 MG/ML IJ SOLN
INTRAMUSCULAR | Status: AC
  Filled 2016-06-07: qty 1

## 2016-06-07 MED ORDER — METOCLOPRAMIDE HCL 5 MG/ML IJ SOLN: 10.0000 mg | Freq: Once | INTRAMUSCULAR | Status: DC | PRN

## 2016-06-07 MED ORDER — OXYCODONE-ACETAMINOPHEN 5-325 MG PO TABS
1.0000 | ORAL_TABLET | ORAL | Status: DC | PRN
  Administered 2016-06-07 (×2): 1 via ORAL
  Administered 2016-06-07 – 2016-06-08 (×2): 2 via ORAL
  Administered 2016-06-08: 1 via ORAL
  Administered 2016-06-08: 2 via ORAL
  Filled 2016-06-07: qty 2
  Filled 2016-06-07: qty 1
  Filled 2016-06-07: qty 2
  Filled 2016-06-07 (×2): qty 1
  Filled 2016-06-07: qty 2

## 2016-06-07 MED ORDER — PROPOFOL 10 MG/ML IV BOLUS
INTRAVENOUS | Status: AC
  Filled 2016-06-07: qty 20

## 2016-06-07 MED ORDER — CEFAZOLIN SODIUM-DEXTROSE 2-4 GM/100ML-% IV SOLN
2.0000 g | INTRAVENOUS | Status: DC
  Administered 2016-06-07 (×2): 1 g via INTRAVENOUS

## 2016-06-07 MED ORDER — ONDANSETRON HCL 4 MG PO TABS: 4.0000 mg | ORAL_TABLET | Freq: Four times a day (QID) | ORAL | Status: DC | PRN

## 2016-06-07 MED ORDER — LACTATED RINGERS IV SOLN
INTRAVENOUS | Status: DC
  Administered 2016-06-07 (×2): via INTRAVENOUS

## 2016-06-07 MED ORDER — BUPIVACAINE HCL (PF) 0.5 % IJ SOLN
INTRAMUSCULAR | Status: AC
  Filled 2016-06-07: qty 60

## 2016-06-07 MED ORDER — MIDAZOLAM HCL 2 MG/2ML IJ SOLN
INTRAMUSCULAR | Status: AC
  Filled 2016-06-07: qty 2

## 2016-06-07 MED ORDER — HYDROMORPHONE HCL 1 MG/ML IJ SOLN
0.2500 mg | INTRAMUSCULAR | Status: DC | PRN
  Administered 2016-06-07 (×3): 0.5 mg via INTRAVENOUS

## 2016-06-07 MED ORDER — FENTANYL CITRATE (PF) 100 MCG/2ML IJ SOLN
INTRAMUSCULAR | Status: DC | PRN
  Administered 2016-06-07 (×2): 100 ug via INTRAVENOUS
  Administered 2016-06-07: 50 ug via INTRAVENOUS

## 2016-06-07 MED ORDER — GLYCOPYRROLATE 0.2 MG/ML IJ SOLN
INTRAMUSCULAR | Status: DC | PRN
  Administered 2016-06-07: 0.1 mg via INTRAVENOUS

## 2016-06-07 MED ORDER — SCOPOLAMINE 1 MG/3DAYS TD PT72
1.0000 | MEDICATED_PATCH | Freq: Once | TRANSDERMAL | Status: DC
  Administered 2016-06-07: 1.5 mg via TRANSDERMAL

## 2016-06-07 MED ORDER — SUGAMMADEX SODIUM 200 MG/2ML IV SOLN
INTRAVENOUS | Status: AC
  Filled 2016-06-07: qty 2

## 2016-06-07 MED ORDER — LIDOCAINE HCL (CARDIAC) 20 MG/ML IV SOLN
INTRAVENOUS | Status: DC | PRN
  Administered 2016-06-07: 80 mg via INTRAVENOUS

## 2016-06-07 MED ORDER — BUPIVACAINE-EPINEPHRINE 0.5% -1:200000 IJ SOLN
INTRAMUSCULAR | Status: DC | PRN
  Administered 2016-06-07: 30 mL

## 2016-06-07 MED ORDER — HYDROMORPHONE HCL 1 MG/ML IJ SOLN
INTRAMUSCULAR | Status: DC | PRN
  Administered 2016-06-07 (×2): 0.5 mg via INTRAVENOUS

## 2016-06-07 MED ORDER — ROCURONIUM BROMIDE 100 MG/10ML IV SOLN
INTRAVENOUS | Status: DC | PRN
  Administered 2016-06-07: 40 mg via INTRAVENOUS

## 2016-06-07 MED ORDER — GLYCOPYRROLATE 0.2 MG/ML IJ SOLN
INTRAMUSCULAR | Status: AC
  Filled 2016-06-07: qty 1

## 2016-06-07 MED ORDER — LIDOCAINE HCL (CARDIAC) 20 MG/ML IV SOLN
INTRAVENOUS | Status: AC
  Filled 2016-06-07: qty 5

## 2016-06-07 MED ORDER — DEXAMETHASONE SODIUM PHOSPHATE 10 MG/ML IJ SOLN
INTRAMUSCULAR | Status: DC | PRN
  Administered 2016-06-07: 10 mg via INTRAVENOUS

## 2016-06-07 MED ORDER — IBUPROFEN 800 MG PO TABS
800.0000 mg | ORAL_TABLET | Freq: Three times a day (TID) | ORAL | Status: DC | PRN
  Administered 2016-06-07 – 2016-06-08 (×2): 800 mg via ORAL
  Filled 2016-06-07 (×2): qty 1

## 2016-06-07 MED ORDER — ONDANSETRON HCL 4 MG/2ML IJ SOLN
INTRAMUSCULAR | Status: AC
  Filled 2016-06-07: qty 2

## 2016-06-07 MED ORDER — LACTATED RINGERS IV SOLN
INTRAVENOUS | Status: DC
  Administered 2016-06-07 – 2016-06-08 (×3): via INTRAVENOUS

## 2016-06-07 MED ORDER — SUGAMMADEX SODIUM 200 MG/2ML IV SOLN
INTRAVENOUS | Status: DC | PRN
  Administered 2016-06-07: 200 mg via INTRAVENOUS

## 2016-06-07 MED ORDER — KETOROLAC TROMETHAMINE 30 MG/ML IJ SOLN
INTRAMUSCULAR | Status: AC
  Filled 2016-06-07: qty 1

## 2016-06-07 MED ORDER — BUPIVACAINE-EPINEPHRINE (PF) 0.5% -1:200000 IJ SOLN
INTRAMUSCULAR | Status: AC
  Filled 2016-06-07: qty 30

## 2016-06-07 SURGICAL SUPPLY — 48 items
APL SRG 38 LTWT LNG FL B (MISCELLANEOUS)
APPLICATOR ARISTA FLEXITIP XL (MISCELLANEOUS) IMPLANT
APPLICATOR COTTON TIP 6IN STRL (MISCELLANEOUS) ×4 IMPLANT
CANISTER SUCT 3000ML PPV (MISCELLANEOUS) ×4 IMPLANT
CLOTH BEACON ORANGE TIMEOUT ST (SAFETY) ×4 IMPLANT
CONT PATH 16OZ SNAP LID 3702 (MISCELLANEOUS) ×4 IMPLANT
DECANTER SPIKE VIAL GLASS SM (MISCELLANEOUS) ×8 IMPLANT
DRSG OPSITE POSTOP 3X4 (GAUZE/BANDAGES/DRESSINGS) ×4 IMPLANT
DURAPREP 26ML APPLICATOR (WOUND CARE) ×4 IMPLANT
ELECT REM PT RETURN 9FT ADLT (ELECTROSURGICAL)
ELECTRODE REM PT RTRN 9FT ADLT (ELECTROSURGICAL) IMPLANT
GLOVE BIO SURGEON STRL SZ 6.5 (GLOVE) ×9 IMPLANT
GLOVE BIO SURGEONS STRL SZ 6.5 (GLOVE) ×3
GLOVE BIOGEL PI IND STRL 7.0 (GLOVE) ×4 IMPLANT
GLOVE BIOGEL PI INDICATOR 7.0 (GLOVE) ×4
HEMOSTAT ARISTA ABSORB 3G PWDR (MISCELLANEOUS) IMPLANT
LEGGING LITHOTOMY PAIR STRL (DRAPES) ×4 IMPLANT
NDL SAFETY ECLIPSE 18X1.5 (NEEDLE) ×2 IMPLANT
NDL SPNL 18GX3.5 QUINCKE PK (NEEDLE) ×1 IMPLANT
NEEDLE HYPO 18GX1.5 SHARP (NEEDLE) ×4
NEEDLE INSUFFLATION 120MM (ENDOMECHANICALS) ×4 IMPLANT
NEEDLE MAYO .5 CIRCLE (NEEDLE) ×3 IMPLANT
NEEDLE SPNL 18GX3.5 QUINCKE PK (NEEDLE) ×4 IMPLANT
NS IRRIG 1000ML POUR BTL (IV SOLUTION) ×4 IMPLANT
PACK LAVH (CUSTOM PROCEDURE TRAY) ×4 IMPLANT
PACK ROBOTIC GOWN (GOWN DISPOSABLE) ×4 IMPLANT
PACK TRENDGUARD 450 HYBRID PRO (MISCELLANEOUS) ×1 IMPLANT
PACK TRENDGUARD 600 HYBRD PROC (MISCELLANEOUS) IMPLANT
PROTECTOR NERVE ULNAR (MISCELLANEOUS) ×8 IMPLANT
SET IRRIG TUBING LAPAROSCOPIC (IRRIGATION / IRRIGATOR) IMPLANT
SHEARS HARMONIC ACE PLUS 36CM (ENDOMECHANICALS) IMPLANT
SHEET LAVH (DRAPES) ×3 IMPLANT
SLEEVE XCEL OPT CAN 5 100 (ENDOMECHANICALS) ×4 IMPLANT
SUT VIC AB 0 CT1 27 (SUTURE) ×8
SUT VIC AB 0 CT1 27XBRD ANBCTR (SUTURE) ×2 IMPLANT
SUT VIC AB 0 CT1 36 (SUTURE) ×4 IMPLANT
SUT VIC AB 2-0 CT1 18 (SUTURE) ×12 IMPLANT
SUT VICRYL 0 UR6 27IN ABS (SUTURE) ×8 IMPLANT
SUT VICRYL 4-0 PS2 18IN ABS (SUTURE) ×8 IMPLANT
SYR 30ML LL (SYRINGE) ×4 IMPLANT
SYR BULB IRRIGATION 50ML (SYRINGE) ×4 IMPLANT
TOWEL OR 17X24 6PK STRL BLUE (TOWEL DISPOSABLE) ×12 IMPLANT
TRAY FOLEY CATH SILVER 14FR (SET/KITS/TRAYS/PACK) ×4 IMPLANT
TRENDGUARD 450 HYBRID PRO PACK (MISCELLANEOUS) ×4
TRENDGUARD 600 HYBRID PROC PK (MISCELLANEOUS)
TROCAR OPTI TIP 5M 100M (ENDOMECHANICALS) ×4 IMPLANT
TROCAR XCEL DIL TIP R 11M (ENDOMECHANICALS) ×4 IMPLANT
WARMER LAPAROSCOPE (MISCELLANEOUS) ×4 IMPLANT

## 2016-06-07 NOTE — Anesthesia Procedure Notes (Signed)
Procedure Name: Intubation Date/Time: 06/07/2016 7:34 AM Performed by: Casimer Lanius A Pre-anesthesia Checklist: Patient identified, Emergency Drugs available, Suction available and Patient being monitored Patient Re-evaluated:Patient Re-evaluated prior to inductionOxygen Delivery Method: Circle system utilized and Simple face mask Preoxygenation: Pre-oxygenation with 100% oxygen Intubation Type: IV induction and Cricoid Pressure applied Ventilation: Mask ventilation without difficulty Laryngoscope Size: Mac and 3 Grade View: Grade II Tube type: Oral Tube size: 7.0 mm Number of attempts: 1 Airway Equipment and Method: Stylet Placement Confirmation: ETT inserted through vocal cords under direct vision,  positive ETCO2 and breath sounds checked- equal and bilateral Secured at: 20 (right lip) cm Tube secured with: Tape Dental Injury: Teeth and Oropharynx as per pre-operative assessment

## 2016-06-07 NOTE — H&P (Signed)
Joy Pittman is an 36 y.o.  M AA P3 (26, 65, and 17 yo kids) here with a decade of CPP. I did a laparoscopy on her in 2011 and found a few adhesions. She has had an u/s recently that showed 2 small fibroids. She describes the pain as "beyond a 10 on the pain scale", her pain is all month long, worse with her periods, pain with sex for about 2 years. She is taking tramadol prescribed by Kandis Cocking, NP.  IBU doesn't help. She has tried OCPs with no help. She wants a hysterectomy. Her periods are fairly heavy with clots. She is slightly anemic. Her u/s showed a small fibroid. She has dyspareunia.     Menstrual History: Menarche age: 42 Patient's last menstrual period was 05/12/2016 (exact date).    Past Medical History:  Diagnosis Date  . ADHD (attention deficit hyperactivity disorder)   . Anxiety   . Bipolar 1 disorder ([place])   . Depression   . Headache    Migraines  . PTSD (post-traumatic stress disorder)   . UTI (lower urinary tract infection)     Past Surgical History:  Procedure Laterality Date  . ECTOPIC PREGNANCY SURGERY    . LAPAROSCOPY    . TUBAL LIGATION    . WISDOM TOOTH EXTRACTION      Family History  Problem Relation Age of Onset  . Cancer Other   . Hypertension Other   . Diabetes Other   . Drug abuse Paternal Grandfather     Social History:  reports that she has never smoked. She has never used smokeless tobacco. She reports that she drinks alcohol. She reports that she does not use drugs.  Allergies:  Allergies  Allergen Reactions  . Kiwi Extract Itching and Swelling    Swelling of lips   . Penicillins Hives, Itching and Swelling    Tolerates amoxicillin, but states it will still make her itch.  Swelling of lips.  Has patient had a PCN reaction causing immediate rash, facial/tongue/throat swelling, SOB or lightheadedness with hypotension: Yes Has patient had a PCN reaction causing severe rash involving mucus membranes or skin necrosis: No Has  patient had a PCN reaction that required hospitalization No Has patient had a PCN reaction occurring within the last 10 years: Yes If all of the above answers are "NO", then may proceed w    Prescriptions Prior to Admission  Medication Sig Dispense Refill Last Dose  . amphetamine-dextroamphetamine (ADDERALL) 20 MG tablet take 1 tablet by mouth 2 times daily  0 Past Week at Unknown time  . FLUoxetine (PROZAC) 40 MG capsule Take 40 mg by mouth daily.   3 06/07/2016 at 0530  . LORazepam (ATIVAN) 1 MG tablet Take 1 mg by mouth daily as needed for anxiety. For anxiety.    Past Week at Unknown time  . Lurasidone HCl (LATUDA) 60 MG TABS Take 60 mg by mouth at bedtime.    Past Week at Unknown time  . nitrofurantoin, macrocrystal-monohydrate, (MACROBID) 100 MG capsule Take 100 mg by mouth daily. Takes continuously   06/07/2016 at 0530  . temazepam (RESTORIL) 15 MG capsule Take 15 mg by mouth at bedtime as needed for sleep.  0 Past Month at Unknown time  . traMADol (ULTRAM) 50 MG tablet Take 1 tablet (50 mg total) by mouth every 12 (twelve) hours as needed for moderate pain or severe pain. During your menstrual cycle. 60 tablet 2 Past Month at Unknown time  . fluconazole (DIFLUCAN)  150 MG tablet Take 1 tablet (150 mg total) by mouth daily. 1 tablet 0 Unknown at Unknown time  . oxyCODONE-acetaminophen (PERCOCET/ROXICET) 5-325 MG tablet Take 1-2 tablets by mouth every 6 (six) hours as needed for severe pain. (Patient not taking: Reported on 05/25/2016) 8 tablet 0 Not Taking at Unknown time    ROS  Works at [employer] With same partner for 11 years  Blood pressure 117/73, pulse 66, temperature 98.2 F (36.8 C), temperature source Oral, resp. rate 16, last menstrual period 05/12/2016, SpO2 100 %. Physical Exam  Heart- rrr Lungs- CTAB Abd- benign  Results for orders placed or performed during the hospital encounter of 06/07/16 (from the past 24 hour(s))  Pregnancy, urine     Status: None    Collection Time: 06/07/16  6:00 AM  Result Value Ref Range   Preg Test, Ur NEGATIVE NEGATIVE  Type and screen Erin     Status: None   Collection Time: 06/07/16  6:06 AM  Result Value Ref Range   ABO/RH(D) O POS    Antibody Screen NEG    Sample Expiration 06/10/2016     No results found.  Assessment/Plan: Chronic pelvic pain, menorrhagia, anemia, fibroid- plan for LAVH/BS. If she has good descensus in the OR asleep, then I will do a TVH/BS.  Emily Filbert 06/07/2016, 7:19 AM

## 2016-06-07 NOTE — Anesthesia Postprocedure Evaluation (Signed)
Anesthesia Post Note  Patient: Joy Pittman  Procedure(s) Performed: Procedure(s) (LRB): HYSTERECTOMY VAGINAL, Left  Salpingectomy (N/A)  Patient location during evaluation: PACU Anesthesia Type: General Level of consciousness: awake and alert and oriented Pain management: pain level controlled Vital Signs Assessment: post-procedure vital signs reviewed and stable Respiratory status: spontaneous breathing, nonlabored ventilation, respiratory function stable and patient connected to nasal cannula oxygen Cardiovascular status: blood pressure returned to baseline and stable Postop Assessment: no signs of nausea or vomiting Anesthetic complications: no        Last Vitals:  Vitals:   06/07/16 0945 06/07/16 1000  BP: 112/74   Pulse: (!) 55   Resp: 10 10  Temp:      Last Pain:  Vitals:   06/07/16 1000  TempSrc:   PainSc: Asleep   Pain Goal: Patients Stated Pain Goal: 3 (06/07/16 4431)               Aamina Skiff A.

## 2016-06-07 NOTE — Op Note (Signed)
06/07/2016  8:39 AM  PATIENT:  Joy Pittman  36 y.o. female  PRE-OPERATIVE DIAGNOSIS:  Pelvic Pain  POST-OPERATIVE DIAGNOSIS:  Pelvic Pain  PROCEDURE:  Procedure(s): HYSTERECTOMY VAGINAL, Bilateral Salpingectomy (N/A)  SURGEON:  Surgeon(s) and Role:    * Makana Rostad, Wilhemina Cash, MD - Primary    * Lavonia Drafts, MD - Assisting   ASSISTANTS: Nicola Girt, MS3   ANESTHESIA:   local and general  EBL:  Total I/O In: 1000 [I.V.:1000] Out: 800 [Urine:600; Blood:200]  BLOOD ADMINISTERED:none  DRAINS: none   LOCAL MEDICATIONS USED:  MARCAINE     SPECIMEN:  Source of Specimen:  uterus, left tube  DISPOSITION OF SPECIMEN:  PATHOLOGY  COUNTS:  YES  TOURNIQUET:  * No tourniquets in log *  DICTATION: .Dragon Dictation  PLAN OF CARE: Admit to inpatient   PATIENT DISPOSITION:  PACU - hemodynamically stable.   Delay start of Pharmacological VTE agent (>24hrs) due to surgical blood loss or risk of bleeding: not applicable  The risks, benefits, alternatives of surgery were explained, understood, and accepted. All questions were answered and a consent form was signed. She was taken to the operating room and general anesthesia was applied. She was put in the dorsal lithotomy position. Her vagina was prepped and draped in the usual sterile fashion. A timeout procedure was done.  A Foley catheter was placed and it drained clear urine throughout the case. The cervix was grasped with a single-tooth tenaculum. A total of 60 cc of dilute Marcaine was injected in a circumferential fashion at the cervicovaginal junction. An incision was made at the site. The posterior peritoneum was entered. A long weighted speculum was placed. The anterior peritoneum was entered. A Deaver was placed anteriorly. The uterosacral ligaments were clamped, cut, and ligated. They were tagged and held. 2 Vicryl sutures used throughout this case unless otherwise specified. The uterus was separated from its pelvic  attachments using a similar clamp, cut, ligate technique.  Once the uterus was removed, the bowel was kept out of the operative site with a sponge on a stick. I was able to visualize the left adnexa and grabbed oviduct  with a Babcock clamp. Using a Kelly clamp, I was able to clamp, cut, and ligate the fimbriated end of the left oviduct. I was able to visualize the most proximal end of the oviduct. I removed the fallope ring. I was unable to see the fibriated end of the oviduct so it was therefore left in situ.  I then assured hemostasis of all pedicles. I closed the peritoneum in a purse-string fashion. I used the tagged and held sutures of the uterosacral ligaments to incorporate them into the vaginal cuff angles. I then closed the vaginal cuff in a verticle, running, locking fashion. She was extubated and taken to the recovery room in stable condition.

## 2016-06-07 NOTE — Transfer of Care (Signed)
Immediate Anesthesia Transfer of Care Note  Patient: Joy Pittman  Procedure(s) Performed: Procedure(s): HYSTERECTOMY VAGINAL, Bilateral Salpingectomy (N/A)  Patient Location: PACU  Anesthesia Type:General  Level of Consciousness: awake and patient cooperative  Airway & Oxygen Therapy: Patient Spontanous Breathing and Patient connected to nasal cannula oxygen  Post-op Assessment: Report given to RN and Post -op Vital signs reviewed and stable  Post vital signs: Reviewed and stable  Last Vitals:  Vitals:   06/07/16 0621  BP: 117/73  Pulse: 66  Resp: 16  Temp: 36.8 C    Last Pain:  Vitals:   06/07/16 0621  TempSrc: Oral  PainSc: 2       Patients Stated Pain Goal: 3 (36/68/15 9470)  Complications: No apparent anesthesia complications

## 2016-06-08 ENCOUNTER — Encounter (HOSPITAL_COMMUNITY): Payer: Self-pay

## 2016-06-08 DIAGNOSIS — Z90710 Acquired absence of both cervix and uterus: Secondary | ICD-10-CM | POA: Diagnosis present

## 2016-06-08 LAB — CBC
HCT: 30.6 % — ABNORMAL LOW (ref 36.0–46.0)
HEMOGLOBIN: 9.9 g/dL — AB (ref 12.0–15.0)
MCH: 26.8 pg (ref 26.0–34.0)
MCHC: 32.4 g/dL (ref 30.0–36.0)
MCV: 82.7 fL (ref 78.0–100.0)
Platelets: 235 10*3/uL (ref 150–400)
RBC: 3.7 MIL/uL — ABNORMAL LOW (ref 3.87–5.11)
RDW: 15.6 % — ABNORMAL HIGH (ref 11.5–15.5)
WBC: 14.2 10*3/uL — ABNORMAL HIGH (ref 4.0–10.5)

## 2016-06-08 MED ORDER — OXYCODONE-ACETAMINOPHEN 5-325 MG PO TABS: 1.0000 | ORAL_TABLET | ORAL | 0 refills | Status: AC | PRN

## 2016-06-08 MED ORDER — IBUPROFEN 800 MG PO TABS: 800.0000 mg | ORAL_TABLET | Freq: Three times a day (TID) | ORAL | 0 refills | Status: AC | PRN

## 2016-06-08 NOTE — Discharge Instructions (Signed)
Hysterectomy Information A hysterectomy is a surgery to remove your uterus. After surgery, you will no longer have periods. Also, you will not be able to get pregnant. Reasons for this surgery  You have bleeding that is not normal and keeps coming back.  You have lasting (chronic) lower belly (pelvic) pain.  You have a lasting infection.  The lining of your uterus grows outside your uterus.  The lining of your uterus grows in the muscle of your uterus.  Your uterus falls down into your vagina.  You have a growth in your uterus that causes problems.  You have cells that could turn into cancer (precancerous cells).  You have cancer of the uterus or cervix. Types There are 3 types of hysterectomies. Depending on the type, the surgery will:  Remove the top part of the uterus only.  Remove the uterus and the cervix.  Remove the uterus, cervix, and tissue that holds the uterus in place in the lower belly. Ways a hysterectomy can be performed There are 5 ways this surgery can be performed.  A cut (incision) is made in the belly (abdomen). The uterus is taken out through the cut.  A cut is made in the vagina. The uterus is taken out through the cut.  Three or four cuts are made in the belly. A surgical device with a camera is put through one of the cuts. The uterus is cut into small pieces. The uterus is taken out through the cuts or the vagina.  Three or four cuts are made in the belly. A surgical device with a camera is put through one of the cuts. The uterus is taken out through the vagina.  Three or four cuts are made in the belly. A surgical device that is controlled by a computer makes a visual image. The device helps the surgeon control the surgical tools. The uterus is cut into small pieces. The pieces are taken out through the cuts or through the vagina. What can I expect after the surgery?  You will be given pain medicine.  You will need help at home for 3-5 days after  surgery.  You will need to see your doctor in 2-4 weeks after surgery.  You may get hot flashes, have night sweats, and have trouble sleeping.  You may need to have Pap tests in the future if your surgery was related to cancer. Talk to your doctor. It is still good to have regular exams. This information is not intended to replace advice given to you by your health care provider. Make sure you discuss any questions you have with your health care provider. Document Released: 03/28/2011 Document Revised: 06/11/2015 Document Reviewed: 09/10/2012 Elsevier Interactive Patient Education  2017 [employer]. Vaginal Hysterectomy, Care After Refer to this sheet in the next few weeks. These instructions provide you with information about caring for yourself after your procedure. Your health care provider may also give you more specific instructions. Your treatment has been planned according to current medical practices, but problems sometimes occur. Call your health care provider if you have any problems or questions after your procedure. What can I expect after the procedure? After the procedure, it is common to have:  Pain.  Soreness and numbness in your incision areas.  Vaginal bleeding and discharge.  Constipation.  Temporary problems emptying the bladder.  Feelings of sadness or other emotions. Follow these instructions at home: Medicines   Take over-the-counter and prescription medicines only as told by your health care  provider.  If you were prescribed an antibiotic medicine, take it as told by your health care provider. Do not stop taking the antibiotic even if you start to feel better.  Do not drive or operate heavy machinery while taking prescription pain medicine. Activity   Return to your normal activities as told by your health care provider. Ask your health care provider what activities are safe for you.  Get regular exercise as told by your health care provider. You may be  told to take short walks every day and go farther each time.  Do not lift anything that is heavier than 10 lb (4.5 kg). General instructions    Do not put anything in your vagina for 6 weeks after your surgery or as told by your health care provider. This includes tampons and douches.  Do not have sex until your health care provider says you can.  Do not take baths, swim, or use a hot tub until your health care provider approves.  Drink enough fluid to keep your urine clear or pale yellow.  Do not drive for 24 hours if you were given a sedative.  Keep all follow-up visits as told by your health care provider. This is important. Contact a health care provider if:  Your pain medicine is not helping.  You have a fever.  You have redness, swelling, or pain at your incision site.  You have blood, pus, or a bad-smelling discharge from your vagina.  You continue to have difficulty urinating. Get help right away if:  You have severe abdominal or back pain.  You have heavy bleeding from your vagina.  You have chest pain or shortness of breath. This information is not intended to replace advice given to you by your health care provider. Make sure you discuss any questions you have with your health care provider. Document Released: 04/27/2015 Document Revised: 06/11/2015 Document Reviewed: 01/18/2015 Elsevier Interactive Patient Education  2017 [employer].

## 2016-06-08 NOTE — Discharge Summary (Signed)
Physician Discharge Summary  Patient ID: Joy Pittman MRN: [id number] DOB/AGE: [birth year] 36 y.o.  Admit date: 06/07/2016 Discharge date: 06/08/2016  Admission Diagnoses: Chronic pelvic pain  Discharge Diagnoses: same Active Problems:   Post-operative state   Discharged Condition: good  Hospital Course: She underwent an uncomplicated TVH/LS. Postoperatively she did well. By POD #1 she was voiding, tolerating po well without nausea or vomitting. She had flatus and voiced her readiness to go home.  Consults: None  Significant Diagnostic Studies: labs: post op hbg stable  Treatments: surgery: TVH/LS  Discharge Exam: Blood pressure 104/64, pulse (!) 58, temperature 98 F (36.7 C), temperature source Oral, resp. rate 18, height 5\' 5"  (1.651 m), weight 93.4 kg (206 lb), last menstrual period 05/12/2016, SpO2 100 %. General appearance: alert and cooperative Resp: clear to auscultation bilaterally Cardio: regular rate and rhythm, S1, S2 normal, no murmur, click, rub or gallop GI: soft, non-tender; bowel sounds normal; no masses,  no organomegaly  Disposition: 01-Home or Self Care   Allergies as of 06/08/2016      Reactions   Kiwi Extract Itching, Swelling   Swelling of lips    Penicillins Hives, Itching, Swelling   Tolerates amoxicillin, but states it will still make her itch.  Swelling of lips. Tolerated Ancef ok. Has patient had a PCN reaction causing immediate rash, facial/tongue/throat swelling, SOB or lightheadedness with hypotension: Yes Has patient had a PCN reaction causing severe rash involving mucus membranes or skin necrosis: No Has patient had a PCN reaction that required hospitalization No Has patient had a PCN reaction occurring within the last 10 years: Yes If all of the above answers are "NO",      Medication List    STOP taking these medications   fluconazole 150 MG tablet Commonly known as:  DIFLUCAN     TAKE these medications    amphetamine-dextroamphetamine 20 MG tablet Commonly known as:  ADDERALL take 1 tablet by mouth 2 times daily   FLUoxetine 40 MG capsule Commonly known as:  PROZAC Take 40 mg by mouth daily.   ibuprofen 800 MG tablet Commonly known as:  ADVIL,MOTRIN Take 1 tablet (800 mg total) by mouth every 8 (eight) hours as needed (mild pain).   LATUDA 60 MG Tabs Generic drug:  Lurasidone HCl Take 60 mg by mouth at bedtime.   LORazepam 1 MG tablet Commonly known as:  ATIVAN Take 1 mg by mouth daily as needed for anxiety. For anxiety.   nitrofurantoin (macrocrystal-monohydrate) 100 MG capsule Commonly known as:  MACROBID Take 100 mg by mouth daily. Takes continuously   oxyCODONE-acetaminophen 5-325 MG tablet Commonly known as:  PERCOCET/ROXICET Take 1-2 tablets by mouth every 4 (four) hours as needed for severe pain (moderate to severe pain (when tolerating fluids)). What changed:  when to take this  reasons to take this   temazepam 15 MG capsule Commonly known as:  RESTORIL Take 15 mg by mouth at bedtime as needed for sleep.   traMADol 50 MG tablet Commonly known as:  ULTRAM Take 1 tablet (50 mg total) by mouth every 12 (twelve) hours as needed for moderate pain or severe pain. During your menstrual cycle.      Follow-up Information    Emily Filbert, MD Follow up.   Specialty:  Obstetrics and Gynecology Why:  The office will know that you will have an appt on 06-29-16 at 9 am. Contact information: [address] [place] [address] [phone]  Signed: Emily Filbert 06/08/2016, 12:21 PM

## 2016-06-08 NOTE — Addendum Note (Signed)
Addendum  created 06/08/16 1056 by Josephine Igo, MD   Anesthesia Staff edited

## 2016-06-08 NOTE — Progress Notes (Signed)
Pt walked out to the car, discharged home.

## 2016-06-08 NOTE — Progress Notes (Signed)
Discharge teaching discussed at this time. Pt has no questions.  Discharge paper work signed and copy given to pt.

## 2016-06-09 ENCOUNTER — Encounter (HOSPITAL_COMMUNITY): Payer: Self-pay | Admitting: Obstetrics & Gynecology

## 2016-06-29 ENCOUNTER — Ambulatory Visit: Payer: Medicaid Other | Admitting: Obstetrics & Gynecology

## 2016-06-29 ENCOUNTER — Encounter: Payer: Self-pay | Admitting: Obstetrics & Gynecology

## 2016-06-29 VITALS — BP 96/64 | HR 87 | Ht 65.0 in | Wt 206.0 lb

## 2016-06-29 DIAGNOSIS — Z9889 Other specified postprocedural states: Secondary | ICD-10-CM

## 2016-06-29 NOTE — Progress Notes (Signed)
   Subjective:    Patient ID: Joy Pittman, female    DOB: [birth year], 36 y.o.   MRN: [id number]  HPI 36 yo AA lady here for a 3 week postop visit so that she can return to work. She had a TVH/LS for chronic pelvic pain. 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 She reports normal bladder habits, some dyschezia. Overall, she feels better than she did prior to surgery. She still has some "soreness" in her pelvis.   Review of Systems     Objective:   Physical Exam [id number] Breathing, conversing, and ambulating normally Abd- benign Cuff- some stitches still visible Bimanual reveals no masses or tenderness       Assessment & Plan:  Post op- may return to work prn Rec pelvic rest until 6 weeks post op RTC 1 year/prn sooner

## 2016-08-04 ENCOUNTER — Encounter (HOSPITAL_COMMUNITY): Payer: Self-pay

## 2017-07-26 ENCOUNTER — Other Ambulatory Visit: Payer: Self-pay

## 2017-07-26 ENCOUNTER — Emergency Department (HOSPITAL_COMMUNITY): Payer: Medicaid Other

## 2017-07-26 ENCOUNTER — Emergency Department (HOSPITAL_COMMUNITY)
Admission: EM | Admit: 2017-07-26 | Discharge: 2017-07-26 | Disposition: A | Discharge status: Home / Self Care | Payer: Medicaid Other | Attending: Emergency Medicine | Admitting: Emergency Medicine

## 2017-07-26 DIAGNOSIS — A599 Trichomoniasis, unspecified: Secondary | ICD-10-CM | POA: Diagnosis not present

## 2017-07-26 DIAGNOSIS — R350 Frequency of micturition: Secondary | ICD-10-CM | POA: Diagnosis present

## 2017-07-26 DIAGNOSIS — N7011 Chronic salpingitis: Secondary | ICD-10-CM | POA: Diagnosis not present

## 2017-07-26 DIAGNOSIS — F909 Attention-deficit hyperactivity disorder, unspecified type: Secondary | ICD-10-CM | POA: Insufficient documentation

## 2017-07-26 DIAGNOSIS — N390 Urinary tract infection, site not specified: Secondary | ICD-10-CM

## 2017-07-26 DIAGNOSIS — Z79899 Other long term (current) drug therapy: Secondary | ICD-10-CM | POA: Diagnosis not present

## 2017-07-26 LAB — URINALYSIS, ROUTINE W REFLEX MICROSCOPIC
BILIRUBIN URINE: NEGATIVE
GLUCOSE, UA: NEGATIVE mg/dL
Ketones, ur: NEGATIVE mg/dL
NITRITE: NEGATIVE
Protein, ur: NEGATIVE mg/dL
RBC / HPF: >50 RBC/hpf — ABNORMAL HIGH (ref 0–5)
SPECIFIC GRAVITY, URINE: 1.023 (ref 1.005–1.030)
pH: 5 (ref 5.0–8.0)

## 2017-07-26 LAB — WET PREP, GENITAL
Clue Cells Wet Prep HPF POC: NONE SEEN
Sperm: NONE SEEN
Yeast Wet Prep HPF POC: NONE SEEN

## 2017-07-26 LAB — POC URINE PREG, ED: Preg Test, Ur: NEGATIVE

## 2017-07-26 MED ORDER — SULFAMETHOXAZOLE-TRIMETHOPRIM 800-160 MG PO TABS: 1.0000 | ORAL_TABLET | Freq: Two times a day (BID) | ORAL | 0 refills | Status: AC

## 2017-07-26 MED ORDER — AZITHROMYCIN 1 G PO PACK
1.0000 g | PACK | Freq: Once | ORAL | Status: AC
  Administered 2017-07-26: 1 g via ORAL
  Filled 2017-07-26: qty 1

## 2017-07-26 MED ORDER — METRONIDAZOLE 500 MG PO TABS
2000.0000 mg | ORAL_TABLET | Freq: Once | ORAL | Status: AC
  Administered 2017-07-26: 2000 mg via ORAL
  Filled 2017-07-26: qty 4

## 2017-07-26 MED ORDER — CEFTRIAXONE SODIUM 250 MG IJ SOLR
250.0000 mg | Freq: Once | INTRAMUSCULAR | Status: AC
  Administered 2017-07-26: 250 mg via INTRAMUSCULAR
  Filled 2017-07-26: qty 250

## 2017-07-26 MED ORDER — ONDANSETRON HCL 4 MG PO TABS
4.0000 mg | ORAL_TABLET | Freq: Once | ORAL | Status: AC
  Administered 2017-07-26: 4 mg via ORAL
  Filled 2017-07-26: qty 1

## 2017-07-26 NOTE — Discharge Instructions (Signed)
Abstain from intercourse until your partners have been treated and you know all of your test results.  You were given Rocephin and Zithromax while in the emergency room today which will cover you for gonorrhea and chlamydia should these test results come back positive.  You were given Flagyl today which treats the tract infection, no further treatment is needed at this time.  You may have a urinary tract infection, your urine may be contaminated by vaginal infection.  Take Septra as prescribed for urinary infection and follow-up with your family doctor.  Return to the emergency room for fevers, worsening pain or any other concerning symptoms.

## 2017-07-26 NOTE — ED Triage Notes (Signed)
Pt to ED from home with c/o of lower pelvic pain, lower back pain, urinary frequency, vaginal discharge and vaginal itching. Pt states this has been going on for a week, she is prone to UTIs and feels like the same symptoms when previously had a UTI.

## 2017-07-26 NOTE — ED Provider Notes (Signed)
[facility] DEPT Provider Note   CSN: [id number] Arrival date & time: 07/26/17  1607     History   Chief Complaint Chief Complaint  Patient presents with  . Recurrent UTI  . Vaginitis  . Vaginal Discharge    HPI Joy Pittman is a 37 y.o. female.  37 year old female presents with complaint of urinary frequency as well as low back ache, lower abdominal sharp pains, yellow vaginal discharge and itching.  Patient states her symptoms started 1 week ago.  Patient states that she takes Macrobid daily due to frequent urinary tract infections, last urinary tract infection was about 3 months ago and was treated with Keflex.  Last had intercourse 3 months ago and does not believe she has any STD.  States that she took a bubble bath the day before her symptoms started patient states that she typically does not develop dysuria with her urinary tract infections.  Patient denies nausea, vomiting, fevers, chills, changes in bowel habits.  No other complaints or concerns.     Past Medical History:  Diagnosis Date  . ADHD (attention deficit hyperactivity disorder)   . Anxiety   . Bipolar 1 disorder ([place])   . Depression   . Headache    Migraines  . PTSD (post-traumatic stress disorder)   . UTI (lower urinary tract infection)     Patient Active Problem List   Diagnosis Date Noted  . H/O vaginal hysterectomy 06/08/2016  . Post-operative state 06/07/2016  . Dysmenorrhea 06/17/2015  . Bipolar affective ([place]) 04/19/2012  . PTSD (post-traumatic stress disorder) 04/19/2012  . Pelvic pain 04/19/2012    Past Surgical History:  Procedure Laterality Date  . ECTOPIC PREGNANCY SURGERY    . LAPAROSCOPY    . TUBAL LIGATION    . VAGINAL HYSTERECTOMY N/A 06/07/2016   Procedure: HYSTERECTOMY VAGINAL, Left  Salpingectomy;  Surgeon: Emily Filbert, MD;  Location: [facility];  Service: Gynecology;  Laterality: N/A;  . WISDOM TOOTH EXTRACTION       OB History    Gravida  4    Para  3   Term  3   Preterm      AB  1   Living  3     SAB      TAB      Ectopic  1   Multiple      Live Births               Home Medications    Prior to Admission medications   Medication Sig Start Date End Date Taking? Authorizing Provider  amphetamine-dextroamphetamine (ADDERALL) 20 MG tablet take 1 tablet by mouth 2 times daily 04/12/16   [provider]  FLUoxetine (PROZAC) 40 MG capsule Take 40 mg by mouth daily.  04/12/16   [provider]  ibuprofen (ADVIL,MOTRIN) 800 MG tablet Take 1 tablet (800 mg total) by mouth every 8 (eight) hours as needed (mild pain). 06/08/16   Emily Filbert, MD  LORazepam (ATIVAN) 1 MG tablet Take 1 mg by mouth daily as needed for anxiety. For anxiety.     [provider]  Lurasidone HCl (LATUDA) 60 MG TABS Take 60 mg by mouth at bedtime.     [provider]  nitrofurantoin, macrocrystal-monohydrate, (MACROBID) 100 MG capsule Take 100 mg by mouth daily. Takes continuously    [provider]  oxyCODONE-acetaminophen (PERCOCET/ROXICET) 5-325 MG tablet Take 1-2 tablets by mouth every 4 (four) hours as needed for severe pain (moderate  to severe pain (when tolerating fluids)). 06/08/16   Emily Filbert, MD  sulfamethoxazole-trimethoprim (BACTRIM DS,SEPTRA DS) 800-160 MG tablet Take 1 tablet by mouth 2 (two) times daily for 7 days. 07/26/17 08/02/17  Suella Broad A, PA-C  temazepam (RESTORIL) 15 MG capsule Take 15 mg by mouth at bedtime as needed for sleep. 02/23/16   [provider]  traMADol (ULTRAM) 50 MG tablet Take 1 tablet (50 mg total) by mouth every 12 (twelve) hours as needed for moderate pain or severe pain. During your menstrual cycle. 02/08/16   Morene Crocker, CNM    Family History Family History  Problem Relation Age of Onset  . Cancer Other   . Hypertension Other   . Diabetes Other   . Drug abuse Paternal Grandfather     Social History Social History   Tobacco Use  .  Smoking status: Never Smoker  . Smokeless tobacco: Never Used  Substance Use Topics  . Alcohol use: Yes    Alcohol/week: 0.0 oz    Comment: OCC  . Drug use: No     Allergies   Kiwi extract and Penicillins   Review of Systems Review of Systems  Constitutional: Negative for chills and fever.  Gastrointestinal: Positive for abdominal pain. Negative for constipation, diarrhea, nausea and vomiting.  Genitourinary: Positive for frequency, urgency and vaginal discharge. Negative for difficulty urinating and dysuria.  Musculoskeletal: Negative for arthralgias, back pain and myalgias.  Skin: Negative for rash and wound.  Allergic/Immunologic: Negative for immunocompromised state.  Neurological: Negative for dizziness and tremors.  Hematological: Does not bruise/bleed easily.  Psychiatric/Behavioral: Negative for confusion.  All other systems reviewed and are negative.    Physical Exam Updated Vital Signs BP 130/90 (BP Location: Right Arm)   Pulse 83   Temp 98.3 F (36.8 C) (Oral)   Resp 15   Ht 5\' 5"  (1.651 m)   Wt 90.7 kg (200 lb)   LMP 05/12/2016 (Exact Date)   SpO2 100%   BMI 33.28 kg/m   Physical Exam  Constitutional: She is oriented to person, place, and time. She appears well-developed and well-nourished. No distress.  HENT:  Head: Normocephalic and atraumatic.  Cardiovascular: Normal rate, regular rhythm, normal heart sounds and intact distal pulses.  No murmur heard. Pulmonary/Chest: Effort normal and breath sounds normal. No respiratory distress.  Abdominal: Soft. She exhibits no distension. There is tenderness in the suprapubic area. There is no CVA tenderness.  Genitourinary: Uterus is tender. Cervix exhibits no motion tenderness and no friability. Right adnexum displays tenderness. Right adnexum displays no mass and no fullness. Left adnexum displays tenderness. Left adnexum displays no mass and no fullness. Vaginal discharge found.  Genitourinary Comments: RN  assist, moderate amount of yellow discharge. Mild general pelvic tenderness.   Neurological: She is alert and oriented to person, place, and time.  Skin: Skin is warm and dry. No rash noted. She is not diaphoretic.  Psychiatric: She has a normal mood and affect. Her behavior is normal.  Nursing note and vitals reviewed.    ED Treatments / Results  Labs (all labs ordered are listed, but only abnormal results are displayed) Labs Reviewed  WET PREP, GENITAL - Abnormal; Notable for the following components:      Result Value   Trich, Wet Prep PRESENT (*)    WBC, Wet Prep HPF POC MANY (*)    All other components within normal limits  URINALYSIS, ROUTINE W REFLEX MICROSCOPIC - Abnormal; Notable for the following components:  APPearance CLOUDY (*)    Hgb urine dipstick MODERATE (*)    Leukocytes, UA LARGE (*)    RBC / HPF >50 (*)    WBC, UA >50 (*)    Bacteria, UA MANY (*)    All other components within normal limits  POC URINE PREG, ED  GC/CHLAMYDIA PROBE AMP ([facility]) NOT AT [facility]    EKG None  Radiology US Transvaginal Non-ob  Result Date: 07/26/2017 CLINICAL DATA:  Pelvic pain times 10 days. Hysterectomy 1 year ago. Right fallopian tube removal. EXAM: TRANSABDOMINAL AND TRANSVAGINAL ULTRASOUND OF PELVIS DOPPLER ULTRASOUND OF OVARIES TECHNIQUE: Both transabdominal and transvaginal ultrasound examinations of the pelvis were performed. Transabdominal technique was performed for global imaging of the pelvis including uterus, ovaries, adnexal regions, and pelvic cul-de-sac. It was necessary to proceed with endovaginal exam following the transabdominal exam to visualize the adnexa. Color and duplex Doppler ultrasound was utilized to evaluate blood flow to the ovaries. COMPARISON:  None. FINDINGS: Uterus Surgically absent Endometrium Not applicable Right ovary Measurements: 2 x 1.5 x 1.5 cm. Normal appearance/no adnexal mass. Physiologic sized follicle measuring 1.2 x 1.1 x 1 cm is noted.  Left ovary Measurements: 3.3 x 2.2 x 2.5 cm. Normal appearance/no adnexal mass. Dominant follicle measuring 2.3 x 2.6 x 2 cm is noted. Pulsed Doppler evaluation of both ovaries demonstrates normal low-resistance arterial and venous waveforms. Other findings Tubular hypoechoic structure in the left adnexa has the appearance of a hydrosalpinx. IMPRESSION: 1. Status post hysterectomy.  No ovarian torsion. 2. Bilateral ovarian follicles, the dominant follicle on the left. 3. Tubular hypoechoic left adnexal structure adjacent to the left ovary raises the possibility of hydrosalpinx. Electronically Signed   By: Ashley Royalty M.D.   On: 07/26/2017 21:52   US Pelvis Complete  Result Date: 07/26/2017 CLINICAL DATA:  Pelvic pain times 10 days. Hysterectomy 1 year ago. Right fallopian tube removal. EXAM: TRANSABDOMINAL AND TRANSVAGINAL ULTRASOUND OF PELVIS DOPPLER ULTRASOUND OF OVARIES TECHNIQUE: Both transabdominal and transvaginal ultrasound examinations of the pelvis were performed. Transabdominal technique was performed for global imaging of the pelvis including uterus, ovaries, adnexal regions, and pelvic cul-de-sac. It was necessary to proceed with endovaginal exam following the transabdominal exam to visualize the adnexa. Color and duplex Doppler ultrasound was utilized to evaluate blood flow to the ovaries. COMPARISON:  None. FINDINGS: Uterus Surgically absent Endometrium Not applicable Right ovary Measurements: 2 x 1.5 x 1.5 cm. Normal appearance/no adnexal mass. Physiologic sized follicle measuring 1.2 x 1.1 x 1 cm is noted. Left ovary Measurements: 3.3 x 2.2 x 2.5 cm. Normal appearance/no adnexal mass. Dominant follicle measuring 2.3 x 2.6 x 2 cm is noted. Pulsed Doppler evaluation of both ovaries demonstrates normal low-resistance arterial and venous waveforms. Other findings Tubular hypoechoic structure in the left adnexa has the appearance of a hydrosalpinx. IMPRESSION: 1. Status post hysterectomy.  No ovarian  torsion. 2. Bilateral ovarian follicles, the dominant follicle on the left. 3. Tubular hypoechoic left adnexal structure adjacent to the left ovary raises the possibility of hydrosalpinx. Electronically Signed   By: Ashley Royalty M.D.   On: 07/26/2017 21:52    Procedures Procedures (including critical care time)  Medications Ordered in ED Medications  cefTRIAXone (ROCEPHIN) injection 250 mg (has no administration in time range)  azithromycin (ZITHROMAX) powder 1 g (has no administration in time range)  ondansetron (ZOFRAN) tablet 4 mg (4 mg Oral Given 07/26/17 2203)  metroNIDAZOLE (FLAGYL) tablet 2,000 mg (2,000 mg Oral Given 07/26/17 2203)  Initial Impression / Assessment and Plan / ED Course  I have reviewed the triage vital signs and the nursing notes.  Pertinent labs & imaging results that were available during my care of the patient were reviewed by me and considered in my medical decision making (see chart for details).  Clinical Course as of Jul 27 2206  Wed Jul 26, 7676  3162 37 year old female presents with complaint of urinary frequency with vaginal discharge and low back and low pelvic pain.  On exam she had a moderate amount of yellow discharge with mild pelvic pain.  Her ultrasound shows left hydrosalpinx, wet prep is positive for trichomoniasis, urinalysis could be contaminated versus urinary tract infection given her history of recurrent urinary tract infections.  Patient has taken Keflex in the past without reaction, she will be given Rocephin and Zithromax to cover for possible pelvic inflammatory disease.  She was given Flagyl while in the emergency room to treat her trick.  Patient discharged home on Septra for urinary tract infection, states she last took Keflex 3 months ago for UTI, takes Macrobid daily.  Case discussed with Dr. Gilford Raid, ER attending, recommends patient follow-up with PCP, no further treatment needed through the emergency room at this time.  Discussed  findings and plan of care with patient, she will follow-up with her PCP, return to ER as needed.   [LM]    Clinical Course User Index [LM] Tacy Learn, PA-C    Final Clinical Impressions(s) / ED Diagnoses   Final diagnoses:  Trichimoniasis  Hydrosalpinx  Urinary tract infection in female    ED Discharge Orders        Ordered    sulfamethoxazole-trimethoprim (BACTRIM DS,SEPTRA DS) 800-160 MG tablet  2 times daily     07/26/17 2206       Tacy Learn, PA-C 07/26/17 2208    Isla Pence, MD 07/26/17 2239

## 2017-07-27 LAB — GC/CHLAMYDIA PROBE AMP (~~LOC~~) NOT AT ARMC
Chlamydia: NEGATIVE
Neisseria Gonorrhea: NEGATIVE

## 2017-12-20 ENCOUNTER — Ambulatory Visit
Admission: RE | Admit: 2017-12-20 | Discharge: 2017-12-20 | Disposition: A | Discharge status: Home / Self Care | Payer: BLUE CROSS/BLUE SHIELD | Source: Ambulatory Visit | Attending: Family Medicine | Admitting: Family Medicine

## 2017-12-20 ENCOUNTER — Other Ambulatory Visit: Payer: Self-pay | Admitting: Family Medicine

## 2017-12-20 DIAGNOSIS — M542 Cervicalgia: Secondary | ICD-10-CM

## 2018-02-06 ENCOUNTER — Other Ambulatory Visit: Payer: Self-pay | Admitting: Internal Medicine

## 2018-02-06 ENCOUNTER — Ambulatory Visit
Admission: RE | Admit: 2018-02-06 | Discharge: 2018-02-06 | Disposition: A | Discharge status: Home / Self Care | Payer: No Typology Code available for payment source | Source: Ambulatory Visit | Attending: Internal Medicine | Admitting: Internal Medicine

## 2018-02-06 DIAGNOSIS — R7611 Nonspecific reaction to tuberculin skin test without active tuberculosis: Secondary | ICD-10-CM

## 2018-05-14 ENCOUNTER — Other Ambulatory Visit: Payer: Self-pay | Admitting: Obstetrics & Gynecology

## 2018-05-14 ENCOUNTER — Ambulatory Visit: Payer: BLUE CROSS/BLUE SHIELD | Admitting: Obstetrics & Gynecology

## 2018-05-14 ENCOUNTER — Encounter: Payer: Self-pay | Admitting: Obstetrics & Gynecology

## 2018-05-14 ENCOUNTER — Other Ambulatory Visit: Payer: Self-pay

## 2018-05-14 VITALS — BP 112/74 | HR 71 | Ht 65.0 in | Wt 211.0 lb

## 2018-05-14 DIAGNOSIS — N6452 Nipple discharge: Secondary | ICD-10-CM

## 2018-05-14 DIAGNOSIS — N644 Mastodynia: Secondary | ICD-10-CM

## 2018-05-14 NOTE — Progress Notes (Signed)
   Subjective:    Patient ID: Joy Pittman, female    DOB: 02-06-1980, 38 y.o.   MRN: [id number]  HPI 38 yo married P3 is here today with a 3 day h/o right breast pain and nipple discharge. IBU eases the pain somewhat. She cannot lie on her right side because that makes it worse. 6/10 on pain scale. She denies a FH of breast cancer.   Review of Systems She breastfed 2 of her 3 kids. 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    Objective:   Physical Exam Breathing, conversing, and ambulating normally Well nourished, well hydrated Black female, no apparent distress Normal breasts bilaterally No lymphadenopathy noted     Assessment & Plan:  Right breast pain and nipple discharge Check TSH, prolactin Schedule diagnostic mammogram

## 2018-05-14 NOTE — Progress Notes (Signed)
Pt c/o pain in right breast and nipple discharge.

## 2018-05-15 LAB — TSH: TSH: 0.78 mIU/L

## 2018-05-15 LAB — PROLACTIN: Prolactin: 10.6 ng/mL

## 2018-05-22 ENCOUNTER — Ambulatory Visit
Admission: RE | Admit: 2018-05-22 | Discharge: 2018-05-22 | Disposition: A | Discharge status: Home / Self Care | Payer: Medicaid Other | Source: Ambulatory Visit | Attending: Obstetrics & Gynecology | Admitting: Obstetrics & Gynecology

## 2018-05-22 ENCOUNTER — Other Ambulatory Visit: Payer: Self-pay

## 2018-05-22 DIAGNOSIS — N644 Mastodynia: Secondary | ICD-10-CM

## 2018-05-22 DIAGNOSIS — N6452 Nipple discharge: Secondary | ICD-10-CM

## 2018-07-21 ENCOUNTER — Encounter (HOSPITAL_COMMUNITY): Payer: Self-pay

## 2018-07-21 ENCOUNTER — Other Ambulatory Visit: Payer: Self-pay

## 2018-07-21 ENCOUNTER — Emergency Department (HOSPITAL_COMMUNITY)
Admission: EM | Admit: 2018-07-21 | Discharge: 2018-07-22 | Disposition: A | Discharge status: Home / Self Care | Payer: BC Managed Care – PPO | Attending: Emergency Medicine | Admitting: Emergency Medicine

## 2018-07-21 ENCOUNTER — Emergency Department (HOSPITAL_COMMUNITY): Payer: BC Managed Care – PPO

## 2018-07-21 DIAGNOSIS — Z79899 Other long term (current) drug therapy: Secondary | ICD-10-CM | POA: Insufficient documentation

## 2018-07-21 DIAGNOSIS — R0789 Other chest pain: Secondary | ICD-10-CM

## 2018-07-21 DIAGNOSIS — R079 Chest pain, unspecified: Secondary | ICD-10-CM | POA: Diagnosis present

## 2018-07-21 LAB — I-STAT BETA HCG BLOOD, ED (MC, WL, AP ONLY): I-stat hCG, quantitative: <5 m[IU]/mL (ref <5)

## 2018-07-21 MED ORDER — ALUM & MAG HYDROXIDE-SIMETH 200-200-20 MG/5ML PO SUSP
30.0000 mL | Freq: Once | ORAL | Status: AC
  Administered 2018-07-21: 30 mL via ORAL
  Filled 2018-07-21: qty 30

## 2018-07-21 MED ORDER — LIDOCAINE VISCOUS HCL 2 % MT SOLN
15.0000 mL | Freq: Once | OROMUCOSAL | Status: AC
  Administered 2018-07-21: 15 mL via ORAL
  Filled 2018-07-21: qty 15

## 2018-07-21 NOTE — ED Triage Notes (Signed)
Pt arrived with complaints of chest pain for the last two days. Pt denies any shortness of breath, nausea, vomiting, or diarrhea. Does report that the pain feels like it is going into her back as well.

## 2018-07-21 NOTE — ED Provider Notes (Signed)
[facility] DEPT Provider Note   CSN: [id number] Arrival date & time: 07/21/18  2241    History   Chief Complaint Chief Complaint  Patient presents with  . Chest Pain    HPI Joy Pittman is a 38 y.o. female with PMH/o ADHD, anxiety, Bipolar, HA who presents for evaluation of chest pain that began yesterday.  She reports that she was pushing a med cart when the symptoms first started.  She states that since then, the pain has been constant and never goes away.  She does states that it fluctuates in intensity.  She denies any associated nausea/vomiting, diaphoresis.  She repeats that the pain starts in her chest and feels like it radiates to her back.  She states that this pain is worse with deep inspiration, palpation of the area and with movement.  He states she does not have any associated shortness of breath.  She denies any worsening pain with exertion.  She has tried ibuprofen and muscle relaxers with no improvement in the pain.  She denies any previous trauma, injury, fall.  She has not done any strenuous workouts or heavy lifting.  Patient states that she has noticed some small amount of abdominal pain began yesterday 2.  She denies any associated nausea/vomiting has been able to eat and drink without any difficulty.  Patient denies any personal cardiac history.  She denies any family history of MIs for the age of 31.  She does not smoke and denies any IV drug use, cocaine use.  She has no history of hypertension or diabetes. She denies any OCP use, recent immobilization, prior history of DVT/PE, recent surgery, leg swelling, or long travel.  She denies any known known COVID-19 exposure.  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      The history is provided by the patient.    Past Medical History:  Diagnosis Date  . ADHD (attention deficit hyperactivity disorder)   . Anxiety   . Bipolar 1 disorder ([place])    . Depression   . Headache    Migraines  . PTSD (post-traumatic stress disorder)   . UTI (lower urinary tract infection)     Patient Active Problem List   Diagnosis Date Noted  . H/O vaginal hysterectomy 06/08/2016  . Post-operative state 06/07/2016  . Dysmenorrhea 06/17/2015  . Bipolar affective ([place]) 04/19/2012  . PTSD (post-traumatic stress disorder) 04/19/2012  . Pelvic pain 04/19/2012    Past Surgical History:  Procedure Laterality Date  . ECTOPIC PREGNANCY SURGERY    . LAPAROSCOPY    . TUBAL LIGATION    . VAGINAL HYSTERECTOMY N/A 06/07/2016   Procedure: HYSTERECTOMY VAGINAL, Left  Salpingectomy;  Surgeon: Emily Filbert, MD;  Location: [facility];  Service: Gynecology;  Laterality: N/A;  . WISDOM TOOTH EXTRACTION       OB History    Gravida  4   Para  3   Term  3   Preterm      AB  1   Living  3     SAB      TAB      Ectopic  1   Multiple      Live Births               Home Medications    Prior to Admission medications   Medication Sig Start Date End Date Taking? Authorizing Provider  FLUoxetine (PROZAC) 40 MG  capsule Take 40 mg by mouth daily.  04/12/16  Yes [provider]  ibuprofen (ADVIL,MOTRIN) 800 MG tablet Take 1 tablet (800 mg total) by mouth every 8 (eight) hours as needed (mild pain). 06/08/16  Yes Dove, Myra C, MD  LORazepam (ATIVAN) 1 MG tablet Take 1 mg by mouth daily as needed for anxiety. For anxiety.    Yes [provider]  Lurasidone HCl (LATUDA) 60 MG TABS Take 60 mg by mouth at bedtime.    Yes [provider]  amphetamine-dextroamphetamine (ADDERALL) 20 MG tablet Take 20 mg by mouth 2 (two) times daily.  04/12/16   [provider]  cyclobenzaprine (FLEXERIL) 10 MG tablet Take 10 mg by mouth 3 (three) times daily as needed for muscle spasms.  03/05/18   [provider]  methocarbamol (ROBAXIN) 500 MG tablet Take 1 tablet (500 mg total) by mouth 2 (two) times daily. 07/22/18   Volanda Napoleon, PA-C  oxyCODONE-acetaminophen (PERCOCET/ROXICET) 5-325 MG tablet Take 1-2 tablets by mouth every 4 (four) hours as needed for severe pain (moderate to severe pain (when tolerating fluids)). Patient not taking: Reported on 05/14/2018 06/08/16   Emily Filbert, MD  traMADol (ULTRAM) 50 MG tablet Take 1 tablet (50 mg total) by mouth every 12 (twelve) hours as needed for moderate pain or severe pain. During your menstrual cycle. Patient not taking: Reported on 05/14/2018 02/08/16   Morene Crocker, CNM    Family History Family History  Problem Relation Age of Onset  . Cancer Other   . Hypertension Other   . Diabetes Other   . Drug abuse Paternal Grandfather     Social History Social History   Tobacco Use  . Smoking status: Never Smoker  . Smokeless tobacco: Never Used  Substance Use Topics  . Alcohol use: Yes    Alcohol/week: 0.0 standard drinks    Comment: OCC  . Drug use: No     Allergies   Kiwi extract and Penicillins   Review of Systems Review of Systems  Constitutional: Negative for fever.  Respiratory: Negative for cough and shortness of breath.   Cardiovascular: Positive for chest pain.  Gastrointestinal: Positive for abdominal pain. Negative for nausea and vomiting.  Genitourinary: Negative for dysuria and hematuria.  Neurological: Negative for weakness, numbness and headaches.  All other systems reviewed and are negative.    Physical Exam Updated Vital Signs BP 119/74   Pulse 71   Temp 98.8 F (37.1 C) (Oral)   Resp 17   Ht 5\' 5"  (1.651 m)   LMP 05/12/2016 (Exact Date)   SpO2 99%   BMI 35.11 kg/m   Physical Exam Vitals signs and nursing note reviewed.  Constitutional:      Appearance: Normal appearance. She is well-developed.     Comments: Sitting comfortably on examination table  HENT:     Head: Normocephalic and atraumatic.  Eyes:     General: Lids are normal.     Conjunctiva/sclera: Conjunctivae normal.     Pupils: Pupils are equal, round,  and reactive to light.  Neck:     Musculoskeletal: Full passive range of motion without pain.  Cardiovascular:     Rate and Rhythm: Normal rate and regular rhythm.     Pulses: Normal pulses.          Radial pulses are 2+ on the right side and 2+ on the left side.       Dorsalis pedis pulses are 2+ on the right side  and 2+ on the left side.     Heart sounds: Normal heart sounds. No murmur. No friction rub. No gallop.   Pulmonary:     Effort: Pulmonary effort is normal.     Breath sounds: Normal breath sounds.     Comments: Lungs clear to auscultation bilaterally.  Symmetric chest rise.  No wheezing, rales, rhonchi. Chest:     Comments: Pain reproduced with palpation of the midsternal anterior chest wall.  No deformity or crepitus noted.  No overlying rash. Abdominal:     Palpations: Abdomen is soft. Abdomen is not rigid.     Tenderness: There is abdominal tenderness in the suprapubic area. There is no guarding.     Comments: And is soft, nondistended.  Mild suprapubic tenderness.  No rigidity, guarding.  No McBurney's point tenderness.  Musculoskeletal: Normal range of motion.     Comments: Bilateral lower extremities are symmetric in appearance without any overlying warmth, erythema, edema.  Skin:    General: Skin is warm and dry.     Capillary Refill: Capillary refill takes less than 2 seconds.     Comments: Good distal cap refill.   Neurological:     Mental Status: She is alert and oriented to person, place, and time.  Psychiatric:        Speech: Speech normal.      ED Treatments / Results  Labs (all labs ordered are listed, but only abnormal results are displayed) Labs Reviewed  CBC WITH DIFFERENTIAL/PLATELET - Abnormal; Notable for the following components:      Result Value   MCH 25.9 (*)    MCHC 29.8 (*)    All other components within normal limits  BASIC METABOLIC PANEL - Abnormal; Notable for the following components:   Glucose, Bld 109 (*)    All other components  within normal limits  TROPONIN I (HIGH SENSITIVITY)  D-DIMER, QUANTITATIVE (NOT AT [facility])  TROPONIN I (HIGH SENSITIVITY)  URINALYSIS, ROUTINE W REFLEX MICROSCOPIC  I-STAT BETA HCG BLOOD, ED ([facility], [facility], [facility] ONLY)    EKG EKG Interpretation  Date/Time:  Saturday July 21 2018 23:04:46 EDT Ventricular Rate:  73 PR Interval:    QRS Duration: 79 QT Interval:  385 QTC Calculation: 425 R Axis:   57 Text Interpretation:  Sinus rhythm Confirmed by Randal Buba, April ([id number]) on 07/21/2018 11:07:45 PM   Radiology Dg Chest 2 View  Result Date: 07/22/2018 CLINICAL DATA:  Chest pain EXAM: CHEST - 2 VIEW COMPARISON:  02/06/2018 FINDINGS: The cardiomediastinal contours are normal. The lungs are clear. Pulmonary vasculature is normal. No consolidation, pleural effusion, or pneumothorax. No acute osseous abnormalities are seen. IMPRESSION: No acute chest findings. Electronically Signed   By: Keith Rake M.D.   On: 07/22/2018 00:44    Procedures Procedures (including critical care time)  Medications Ordered in ED Medications  alum & mag hydroxide-simeth (MAALOX/MYLANTA) 200-200-20 MG/5ML suspension 30 mL (30 mLs Oral Given 07/21/18 2348)    And  lidocaine (XYLOCAINE) 2 % viscous mouth solution 15 mL (15 mLs Oral Given 07/21/18 2348)  morphine 4 MG/ML injection 4 mg (4 mg Intravenous Given 07/22/18 0206)  ondansetron (ZOFRAN) injection 4 mg (4 mg Intravenous Given 07/22/18 0206)  sodium chloride 0.9 % bolus 1,000 mL (0 mLs Intravenous Stopped 07/22/18 0346)     Initial Impression / Assessment and Plan / ED Course  I have reviewed the triage vital signs and the nursing notes.  Pertinent labs & imaging results that were available during my care of  the patient were reviewed by me and considered in my medical decision making (see chart for details).        38 y.o. F with PMH/o Bipolar, anxiety who presents for evaluation of chest pain that began yesterday.  No associated nausea/vomiting, diaphoresis,  shortness of breath.  Pain worse with movement, palpation in the area and deep inspiration.  No PE risk factors.  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  No personal cardiac history. Patient is afebrile, non-toxic appearing, sitting comfortably on examination table. Vital signs reviewed and stable.  Doubt ACS etiology given that her pain is been constant for the last 2 days.  Additionally, do not feel that this is representative of dissection given that this is been constant for the last almost 48 hours.  She has equal pulses in all 4 extremities and vitals are stable.  Her musculoskeletal pain versus GERD.  We will plan to check labs, chest x-ray.  D-dimer is negative.  I-STAT beta is negative.  Troponin is negative.  CBC shows no leukocytosis or anemia.  BMP is unremarkable.  Chest x-ray shows no acute abnormalities.  At this time, given a negative d-dimer as well as lack of tachycardia or hypoxia, I do not feel that patient symptoms are result of PE.  Additionally, given the negative d-dimer, the fact that her pain has been consistently ongoing for 2 days, her reassuring appearance and her reassuring vitals, do not suspect that her symptoms are caused by aortic dissection.  Additionally, her story is atypical and does not sound concerning for ACS etiology and it has been constant for the last 2 days, one troponin is sufficient for ACS rule out.  I suspect this is most likely due to musculoskeletal strain.  Reevaluation.  Patient reports improvement in pain after analgesics.  Vitals are stable.  Patient stable for discharge at this time.  We will plan to treat as musculoskeletal strain.  Discussed with patient regarding home supportive care measures. At this time, patient exhibits no emergent life-threatening condition that require further evaluation in ED or admission. Patient had ample opportunity for questions and  discussion. All patient's questions were answered with full understanding. Strict return precautions discussed. Patient expresses understanding and agreement to plan.   Portions of this note were generated with [employer]. Dictation errors may occur despite best attempts at proofreading.   Final Clinical Impressions(s) / ED Diagnoses   Final diagnoses:  Atypical chest pain    ED Discharge Orders         Ordered    methocarbamol (ROBAXIN) 500 MG tablet  2 times daily     07/22/18 0222           Volanda Napoleon, PA-C 07/22/18 Lyndee Leo, April, MD 07/22/18 [phone]

## 2018-07-22 LAB — CBC WITH DIFFERENTIAL/PLATELET
Abs Immature Granulocytes: 0.03 10*3/uL (ref 0.00–0.07)
Basophils Absolute: 0 10*3/uL (ref 0.0–0.1)
Basophils Relative: 1 %
Eosinophils Absolute: 0.1 10*3/uL (ref 0.0–0.5)
Eosinophils Relative: 2 %
HCT: 40.6 % (ref 36.0–46.0)
Hemoglobin: 12.1 g/dL (ref 12.0–15.0)
Immature Granulocytes: 1 %
Lymphocytes Relative: 30 %
Lymphs Abs: 2 10*3/uL (ref 0.7–4.0)
MCH: 25.9 pg — ABNORMAL LOW (ref 26.0–34.0)
MCHC: 29.8 g/dL — ABNORMAL LOW (ref 30.0–36.0)
MCV: 86.9 fL (ref 80.0–100.0)
Monocytes Absolute: 0.6 10*3/uL (ref 0.1–1.0)
Monocytes Relative: 9 %
Neutro Abs: 3.8 10*3/uL (ref 1.7–7.7)
Neutrophils Relative %: 57 %
Platelets: 288 10*3/uL (ref 150–400)
RBC: 4.67 MIL/uL (ref 3.87–5.11)
RDW: 15.2 % (ref 11.5–15.5)
WBC: 6.5 10*3/uL (ref 4.0–10.5)
nRBC: 0 % (ref 0.0–0.2)

## 2018-07-22 LAB — BASIC METABOLIC PANEL
Anion gap: 7 (ref 5–15)
BUN: 13 mg/dL (ref 6–20)
CO2: 26 mmol/L (ref 22–32)
Calcium: 9.2 mg/dL (ref 8.9–10.3)
Chloride: 107 mmol/L (ref 98–111)
Creatinine, Ser: 0.65 mg/dL (ref 0.44–1.00)
GFR calc Af Amer: >60 mL/min (ref >60)
GFR calc non Af Amer: >60 mL/min (ref >60)
Glucose, Bld: 109 mg/dL — ABNORMAL HIGH (ref 70–99)
Potassium: 3.7 mmol/L (ref 3.5–5.1)
Sodium: 140 mmol/L (ref 135–145)

## 2018-07-22 LAB — D-DIMER, QUANTITATIVE: D-Dimer, Quant: 0.36 ug/mL-FEU (ref 0.00–0.50)

## 2018-07-22 LAB — TROPONIN I (HIGH SENSITIVITY): Troponin I (High Sensitivity): <2 ng/L (ref <18)

## 2018-07-22 MED ORDER — ONDANSETRON HCL 4 MG/2ML IJ SOLN
4.0000 mg | Freq: Once | INTRAMUSCULAR | Status: AC
  Administered 2018-07-22: 4 mg via INTRAVENOUS
  Filled 2018-07-22: qty 2

## 2018-07-22 MED ORDER — METHOCARBAMOL 500 MG PO TABS: 500.0000 mg | ORAL_TABLET | Freq: Two times a day (BID) | ORAL | 0 refills | Status: AC

## 2018-07-22 MED ORDER — MORPHINE SULFATE (PF) 4 MG/ML IV SOLN
4.0000 mg | Freq: Once | INTRAVENOUS | Status: AC
  Administered 2018-07-22: 02:00:00 4 mg via INTRAVENOUS
  Filled 2018-07-22: qty 1

## 2018-07-22 MED ORDER — SODIUM CHLORIDE 0.9 % IV BOLUS
1000.0000 mL | Freq: Once | INTRAVENOUS | Status: AC
  Administered 2018-07-22: 1000 mL via INTRAVENOUS

## 2018-07-22 NOTE — ED Notes (Signed)
Pt made aware urine specimen needed.

## 2018-07-22 NOTE — Discharge Instructions (Signed)
You can take Tylenol or Ibuprofen as directed for pain. You can alternate Tylenol and Ibuprofen every 4 hours. If you take Tylenol at 1pm, then you can take Ibuprofen at 5pm. Then you can take Tylenol again at 9pm.   Take Robaxin as prescribed. This medication will make you drowsy so do not drive or drink alcohol when taking it.  Follow-up with your primary care doctor.  Return to the Emergency Department immediately if you experiencing worsening chest pain, difficulty breathing, nausea/vomiting, get very sweaty, headache or any other worsening or concerning symptoms.

## 2019-05-09 ENCOUNTER — Ambulatory Visit: Payer: BC Managed Care – PPO | Admitting: Obstetrics and Gynecology

## 2019-09-09 ENCOUNTER — Ambulatory Visit
Admission: RE | Admit: 2019-09-09 | Discharge: 2019-09-09 | Disposition: A | Discharge status: Home / Self Care | Payer: BC Managed Care – PPO | Source: Ambulatory Visit | Attending: Family Medicine | Admitting: Family Medicine

## 2019-09-09 ENCOUNTER — Other Ambulatory Visit: Payer: Self-pay | Admitting: Family Medicine

## 2019-09-09 DIAGNOSIS — M25562 Pain in left knee: Secondary | ICD-10-CM

## 2019-09-09 DIAGNOSIS — R262 Difficulty in walking, not elsewhere classified: Secondary | ICD-10-CM

## 2021-07-12 ENCOUNTER — Other Ambulatory Visit: Payer: Self-pay | Admitting: Family Medicine

## 2021-07-12 ENCOUNTER — Ambulatory Visit
Admission: RE | Admit: 2021-07-12 | Discharge: 2021-07-12 | Disposition: A | Discharge status: Home / Self Care | Payer: BC Managed Care – PPO | Source: Ambulatory Visit | Attending: Family Medicine | Admitting: Family Medicine

## 2021-07-12 DIAGNOSIS — R52 Pain, unspecified: Secondary | ICD-10-CM

## 2021-10-11 ENCOUNTER — Other Ambulatory Visit: Payer: Self-pay | Admitting: Family Medicine

## 2021-10-11 ENCOUNTER — Ambulatory Visit
Admission: RE | Admit: 2021-10-11 | Discharge: 2021-10-11 | Disposition: A | Discharge status: Home / Self Care | Payer: 59 | Source: Ambulatory Visit | Attending: Family Medicine | Admitting: Family Medicine

## 2021-10-11 DIAGNOSIS — M25462 Effusion, left knee: Secondary | ICD-10-CM

## 2021-12-01 ENCOUNTER — Encounter: Payer: Self-pay | Admitting: Family Medicine

## 2021-12-01 DIAGNOSIS — Z1231 Encounter for screening mammogram for malignant neoplasm of breast: Secondary | ICD-10-CM

## 2023-05-31 ENCOUNTER — Emergency Department (HOSPITAL_COMMUNITY)
Admission: EM | Admit: 2023-05-31 | Discharge: 2023-06-01 | Disposition: A | Discharge status: Home / Self Care | Attending: Emergency Medicine | Admitting: Emergency Medicine

## 2023-05-31 ENCOUNTER — Encounter (HOSPITAL_COMMUNITY): Payer: Self-pay | Admitting: *Deleted

## 2023-05-31 ENCOUNTER — Emergency Department (HOSPITAL_COMMUNITY)

## 2023-05-31 ENCOUNTER — Other Ambulatory Visit: Payer: Self-pay

## 2023-05-31 DIAGNOSIS — R519 Headache, unspecified: Secondary | ICD-10-CM | POA: Insufficient documentation

## 2023-05-31 DIAGNOSIS — M542 Cervicalgia: Secondary | ICD-10-CM | POA: Diagnosis present

## 2023-05-31 DIAGNOSIS — M546 Pain in thoracic spine: Secondary | ICD-10-CM | POA: Insufficient documentation

## 2023-05-31 DIAGNOSIS — Y92410 Unspecified street and highway as the place of occurrence of the external cause: Secondary | ICD-10-CM | POA: Diagnosis not present

## 2023-05-31 MED ORDER — ONDANSETRON 4 MG PO TBDP
4.0000 mg | ORAL_TABLET | Freq: Once | ORAL | Status: AC
  Administered 2023-05-31: 4 mg via ORAL
  Filled 2023-05-31: qty 1

## 2023-05-31 MED ORDER — HYDROCODONE-ACETAMINOPHEN 5-325 MG PO TABS
1.0000 | ORAL_TABLET | Freq: Once | ORAL | Status: AC
  Administered 2023-05-31: 1 via ORAL
  Filled 2023-05-31: qty 1
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
## 2023-06-01 ENCOUNTER — Emergency Department (HOSPITAL_COMMUNITY)

## 2023-06-01 MED ORDER — HYDROMORPHONE HCL 1 MG/ML IJ SOLN
1.0000 mg | Freq: Once | INTRAMUSCULAR | Status: AC
  Administered 2023-06-01: 1 mg via INTRAVENOUS
  Filled 2023-06-01: qty 1
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
## 2023-06-01 NOTE — ED Notes (Signed)
 Patient transported to CT

## 2023-06-01 NOTE — ED Notes (Signed)
 This RN reviewed discharge instructions with patient. She verbalized understanding and denied any further questions. PT well appearing upon discharge and reports tolerable pain. Pt ambulated with stable gait to exit. Pt endorses ride home.
